# Patient Record
Sex: Female | Born: 1995 | Race: Black or African American | Hispanic: No | Marital: Single | Smoking: Never smoker
Health system: Southern US, Community
[De-identification: ages and names within clinical notes are randomized; demographics above are authoritative.]

## PROBLEM LIST (undated history)

## (undated) DIAGNOSIS — F909 Attention-deficit hyperactivity disorder, unspecified type: Secondary | ICD-10-CM

## (undated) DIAGNOSIS — N946 Dysmenorrhea, unspecified: Secondary | ICD-10-CM

## (undated) DIAGNOSIS — M549 Dorsalgia, unspecified: Secondary | ICD-10-CM

## (undated) HISTORY — DX: Dysmenorrhea, unspecified: N94.6

---
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
## 2017-02-18 ENCOUNTER — Ambulatory Visit: Payer: Medicaid Other | Admitting: Pediatrics

## 2017-05-12 NOTE — Progress Notes (Unsigned)
Error

## 2017-05-30 IMAGING — CT CT T SPINE W/O CM
3 of 4 series · 13 of 33 positions shown, 16 images · IV contrast (iopamidol)
Comparison: None.

CLINICAL DATA: Localized pain at the T12 level. Cystic area under
the skin for 2-3 months.

EXAM:
CT THORACIC SPINE WITH CONTRAST
TECHNIQUE: Multidetector CT images of thoracic was performed according to the
standard protocol following intravenous contrast administration.
CONTRAST:  100mL [id number] IOPAMIDOL ([id number]) INJECTION 61%

[Series 6: sagittal bone · sagittal · 0.23mm/px · 5 of 43 slices shown, 6 images]
[im 15/43  bone]
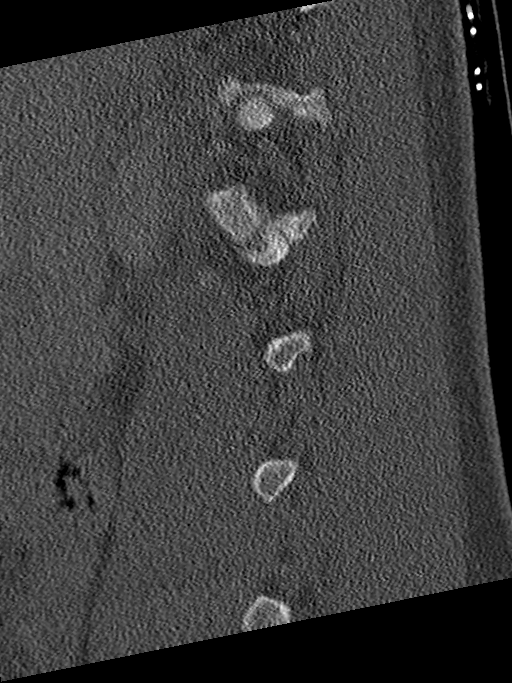
[im 18/43  bone]
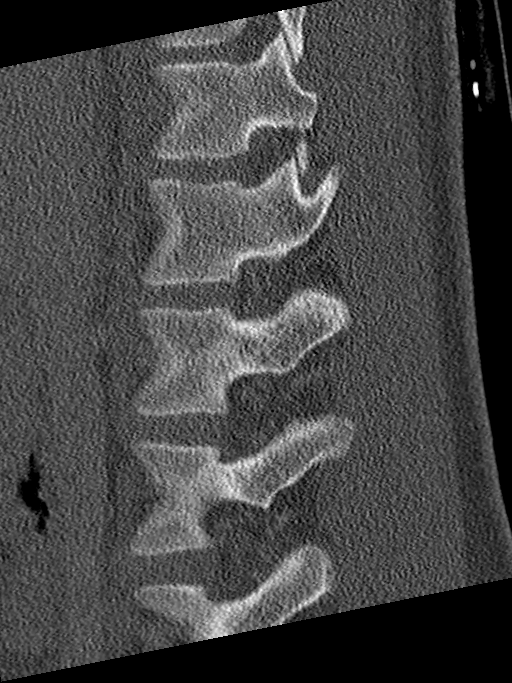
[im 22/43  soft-tissue]
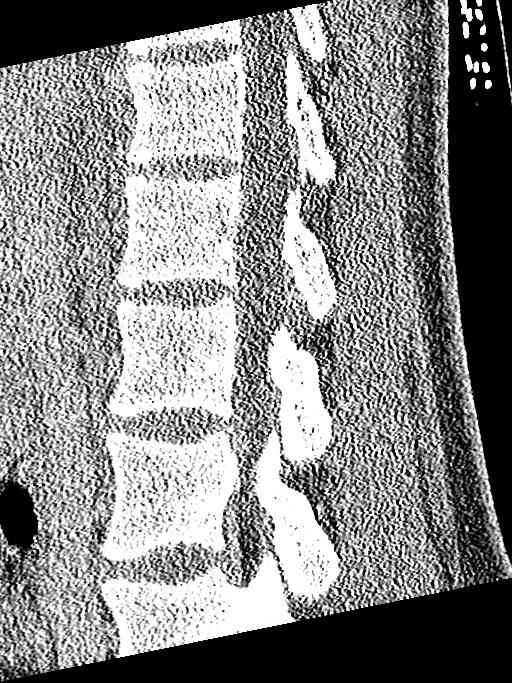
[im 22/43  bone]
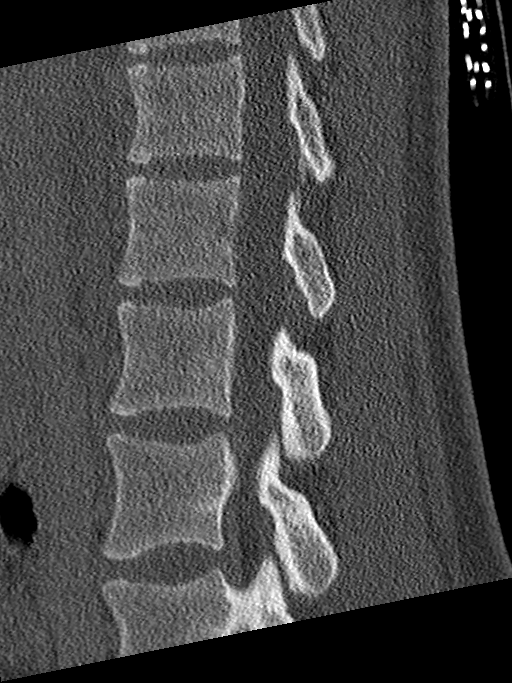
[im 25/43  bone]
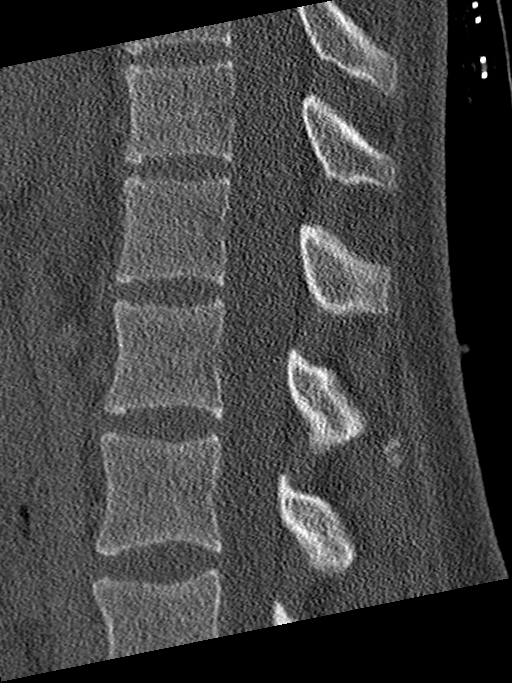
[im 29/43  bone]
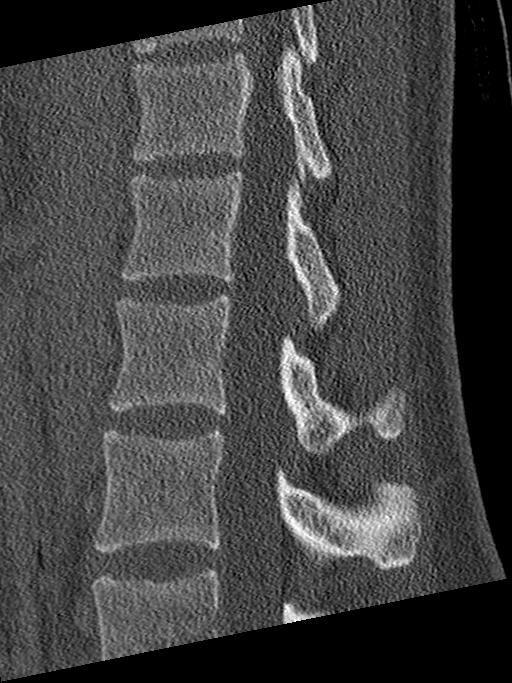

[Series 7: coronal bone · coronal · 0.23mm/px · 3 of 61 slices shown]
[im 13/61  bone]
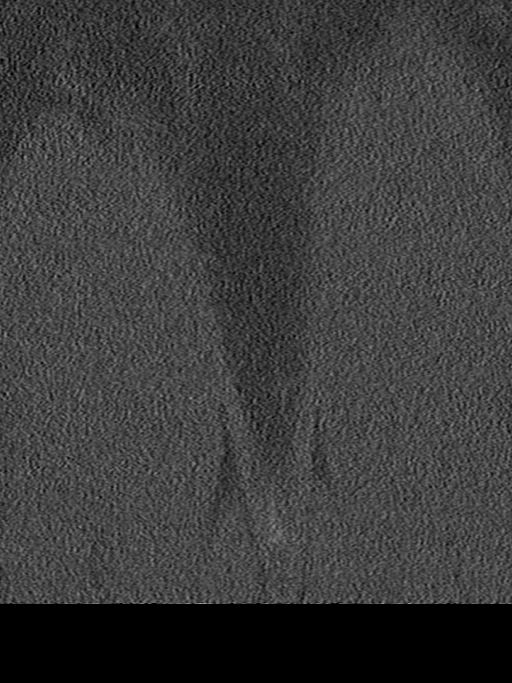
[im 25/61  bone]
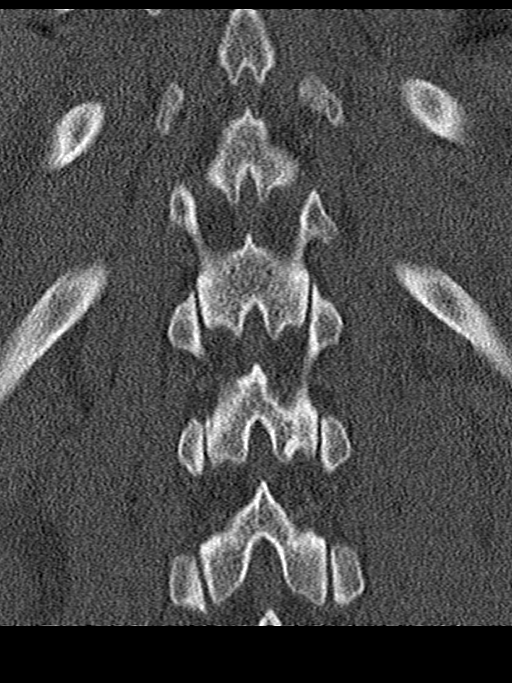
[im 37/61  bone]
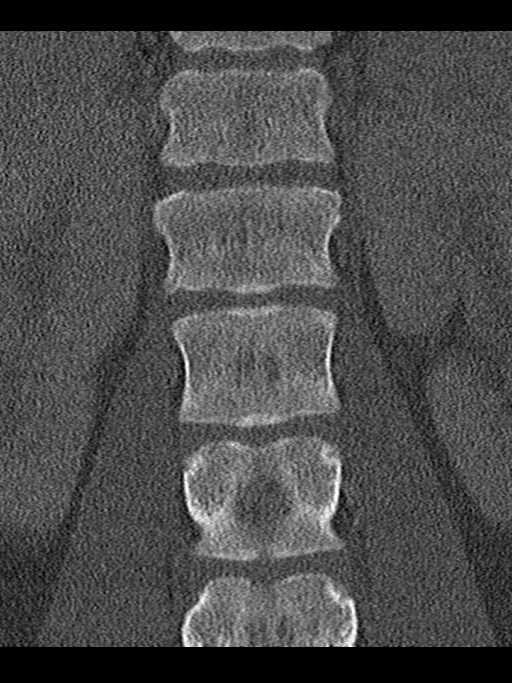

[Series 8: t spine soft true axials · axial · 0.21mm/px · z∈[-345,-250]mm · 5 of 74 slices shown, 7 images]
[im 13/74  soft-tissue]
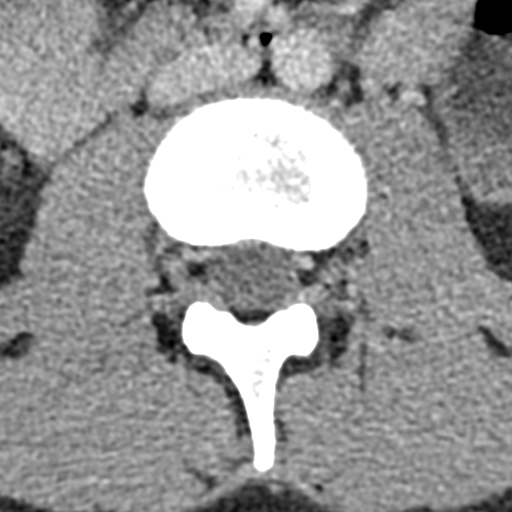
[im 13/74  bone]
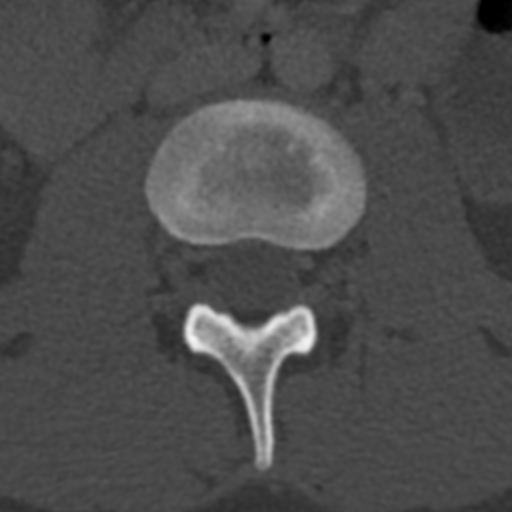
[im 25/74  bone]
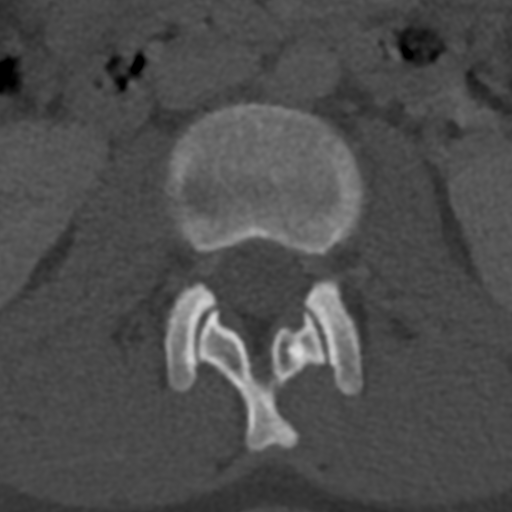
[im 37/74  bone]
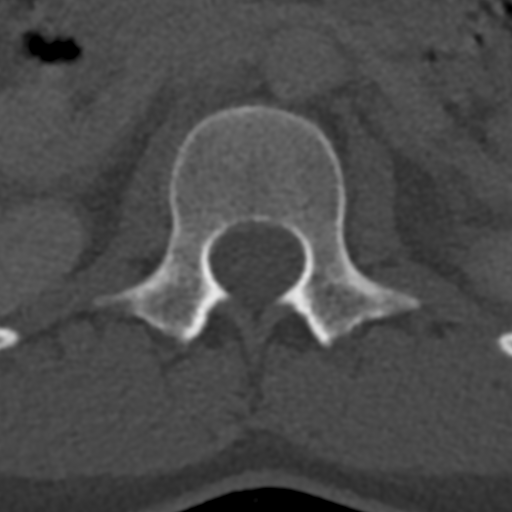
[im 49/74  bone]
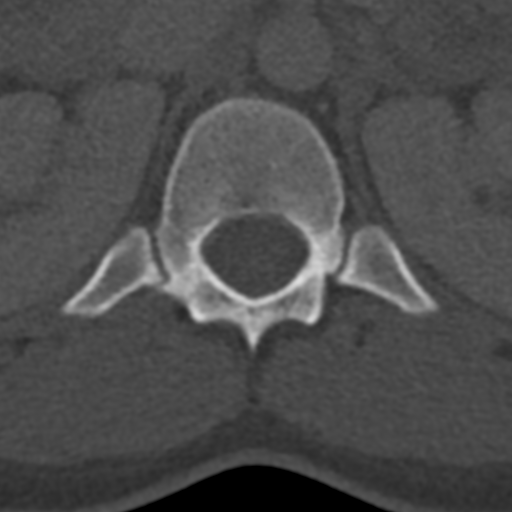
[im 61/74  soft-tissue]
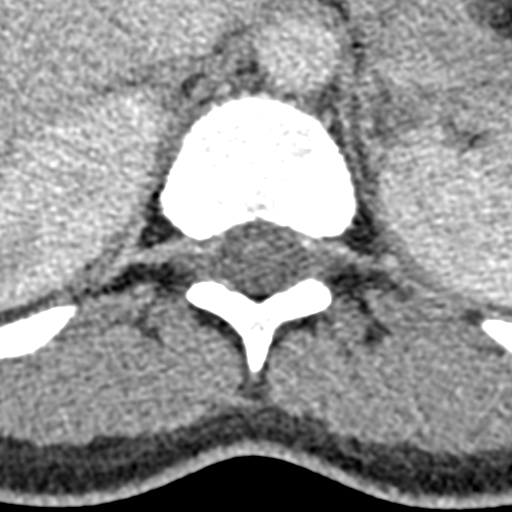
[im 61/74  bone]
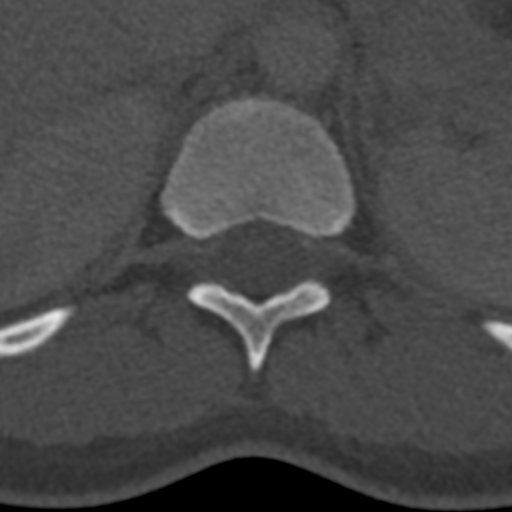

[13 of 33 positions shown; findings below may reference images not displayed]

FINDINGS: A limited thoracic spine CT was performed in the area of clinical
concern as requested by the ordering physician. The lowest
rib-bearing vertebra is designated T12. This study extends from the
inferior T10 to mid L3 levels.

Alignment: Normal.

Vertebrae: Preserved vertebral body heights without evidence of
fracture or osseous lesion. There are no erosive osseous changes to
suggest infectious osteomyelitis.

Paraspinal and other soft tissues: A skin marker placed indicate the
area of clinical concern is located in the midline at the T12-L1
level. No underlying mass, fluid collection, or other abnormality is
identified.

Disc levels: Unremarkable. Widely patent spinal canal and neural
foramina.
IMPRESSION: Unremarkable limited thoracic spine CT. No mass or fluid collection
identified in the area of clinical concern.

## 2018-03-26 ENCOUNTER — Ambulatory Visit (HOSPITAL_COMMUNITY): Admission: EM | Admit: 2018-03-26 | Discharge: 2018-03-26 | Discharge status: Against Medical Advice | Payer: Self-pay

## 2019-08-28 ENCOUNTER — Encounter: Payer: Self-pay | Admitting: Emergency Medicine

## 2019-08-28 ENCOUNTER — Ambulatory Visit
Admission: EM | Admit: 2019-08-28 | Discharge: 2019-08-28 | Disposition: A | Discharge status: Home / Self Care | Payer: Self-pay | Attending: Emergency Medicine | Admitting: Emergency Medicine

## 2019-08-28 DIAGNOSIS — L0231 Cutaneous abscess of buttock: Secondary | ICD-10-CM

## 2019-08-28 MED ORDER — DOXYCYCLINE HYCLATE 100 MG PO CAPS: 100.0000 mg | ORAL_CAPSULE | Freq: Two times a day (BID) | ORAL | 0 refills | Status: DC

## 2019-08-28 NOTE — ED Triage Notes (Signed)
Started Monday, sore area on buttocks. Tender to touch.

## 2019-08-28 NOTE — Discharge Instructions (Addendum)
Apply warm compresses 3-4x daily for 10-15 minutes Wash site daily with warm water and mild soap Take antibiotic as prescribed and to completion Follow up here or with PCP if symptoms persists Return or go to the ED if you have any new or worsening symptoms increased redness, swelling, pain, nausea, vomiting, fever, chills, etc...  

## 2019-08-28 NOTE — ED Provider Notes (Addendum)
[place]   [id number] 08/28/19 Arrival Time: 4818   [id number]  SUBJECTIVE:  Teresa Daniel is a 24 y.o. female who presents with a possible abscess of her buttocks area.  Abscess is hard tender to touch.  Denies drainage and at this time.  Reports similar symptoms in the past that self drained while an antibiotic.  Denies chills, fever, nausea, vomiting, diarrhea.  ROS: As per HPI.  All other pertinent ROS negative.     Past Medical History:  Diagnosis Date  . ADHD (attention deficit hyperactivity disorder)   . Asthma   . Back pain   . Dysmenorrhea in the adolescent    History reviewed. No pertinent surgical history. Allergies  Allergen Reactions  . Codeine Itching and Rash  . Sulfa Antibiotics Itching and Rash  . Sulfasalazine Itching and Rash   No current facility-administered medications on file prior to encounter.   Current Outpatient Medications on File Prior to Encounter  Medication Sig Dispense Refill  . fluticasone (FLONASE) 50 MCG/ACT nasal spray Place 2 sprays into both nostrils daily. 16 g 6  . ibuprofen (ADVIL,MOTRIN) 600 MG tablet Take 1 tablet (600 mg total) by mouth every 6 (six) hours as needed. Take with food 90 tablet 1  . loratadine (CLARITIN) 10 MG tablet Take 1 tablet (10 mg total) by mouth daily. 30 tablet 11  . medroxyPROGESTERone (DEPO-PROVERA) 150 MG/ML injection INJECT 1 ML INTO THE MUSCLE EVERY 3 MONTHS 1 mL 1  . clotrimazole-betamethasone (LOTRISONE) cream Apply to affected area 2 times daily prn 15 g 0  . gabapentin (NEURONTIN) 100 MG capsule Take 300 mg by mouth 3 (three) times daily.    Marland Kitchen terbinafine (LAMISIL) 250 MG tablet Take 1 tablet (250 mg total) by mouth daily. 28 tablet 0   Social History   Socioeconomic History  . Marital status: Single    Spouse name: Not on file  . Number of children: Not on file  . Years of education: Not on file  . Highest education level: Not on file  Occupational History  . Not on file    Tobacco Use  . Smoking status: Never Smoker  . Smokeless tobacco: Never Used  Substance and Sexual Activity  . Alcohol use: No  . Drug use: No  . Sexual activity: Not on file  Other Topics Concern  . Not on file  Social History Narrative  . Not on file   Social Determinants of Health   Financial Resource Strain:   . Difficulty of Paying Living Expenses:   Food Insecurity:   . Worried About Charity fundraiser in the Last Year:   . Arboriculturist in the Last Year:   Transportation Needs:   . Film/video editor (Medical):   Marland Kitchen Lack of Transportation (Non-Medical):   Physical Activity:   . Days of Exercise per Week:   . Minutes of Exercise per Session:   Stress:   . Feeling of Stress :   Social Connections:   . Frequency of Communication with Friends and Family:   . Frequency of Social Gatherings with Friends and Family:   . Attends Religious Services:   . Active Member of Clubs or Organizations:   . Attends Archivist Meetings:   Marland Kitchen Marital Status:   Intimate Partner Violence:   . Fear of Current or Ex-Partner:   . Emotionally Abused:   Marland Kitchen Physically Abused:   . Sexually Abused:    Family History  Problem Relation  Age of Onset  . Hypertension Mother     OBJECTIVE:  Vitals:   08/28/19 1458  BP: (!) 125/55  Pulse: (!) 110  Resp: 16  Temp: 98.9 F (37.2 C)  TempSrc: Oral  SpO2: 97%     Physical Exam Vitals and nursing note reviewed. Exam conducted with a chaperone present.  Constitutional:      General: She is not in acute distress.    Appearance: Normal appearance. She is normal weight. She is not ill-appearing, toxic-appearing or diaphoretic.  Cardiovascular:     Rate and Rhythm: Regular rhythm. Tachycardia present.     Pulses: Normal pulses.     Heart sounds: Normal heart sounds. No murmur. No friction rub. No gallop.   Pulmonary:     Effort: Pulmonary effort is normal. No respiratory distress.     Breath sounds: Normal breath sounds.  No stridor. No wheezing, rhonchi or rales.  Chest:     Chest wall: No tenderness.  Skin:    General: Skin is warm.     Findings: Abscess present.     Comments: Skin: 2.5 cm induration of her Buttocks; tender to touch; no active drainage  Neurological:     Mental Status: She is alert.     Procedure:  ASSESSMENT & PLAN:  1. Cutaneous abscess of buttock     Meds ordered this encounter  Medications  . doxycycline (VIBRAMYCIN) 100 MG capsule    Sig: Take 1 capsule (100 mg total) by mouth 2 (two) times daily.    Dispense:  20 capsule    Refill:  0    Discharge instructions Apply warm compresses 3-4x daily for 10-15 minutes Wash site daily with warm water and mild soap Take antibiotic as prescribed and to completion Follow up here or with PCP if symptoms persists Return or go to the ED if you have any new or worsening symptoms increased redness, swelling, pain, nausea, vomiting, fever, chills, etc...     Reviewed expectations re: course of current medical issues. Questions answered. Outlined signs and symptoms indicating need for more acute intervention. Patient verbalized understanding. After Visit Summary given.          Emerson Monte, FNP 08/28/19 1527    Emerson Monte, FNP 08/28/19 1531

## 2019-08-29 ENCOUNTER — Ambulatory Visit
Admission: EM | Admit: 2019-08-29 | Discharge: 2019-08-29 | Disposition: A | Discharge status: Home / Self Care | Payer: Medicaid Other | Attending: Emergency Medicine | Admitting: Emergency Medicine

## 2019-08-29 ENCOUNTER — Other Ambulatory Visit: Payer: Self-pay

## 2019-08-29 DIAGNOSIS — K611 Rectal abscess: Secondary | ICD-10-CM

## 2019-08-29 MED ORDER — ACETAMINOPHEN 325 MG PO TABS: 650.0000 mg | ORAL_TABLET | Freq: Four times a day (QID) | ORAL | 0 refills | Status: DC | PRN

## 2019-08-29 MED ORDER — CEFTRIAXONE SODIUM 1 G IJ SOLR
1.0000 g | Freq: Once | INTRAMUSCULAR | Status: AC
  Administered 2019-08-29: 1 g via INTRAMUSCULAR

## 2019-08-29 NOTE — Discharge Instructions (Addendum)
Declines incision and drainage at this time 1 gram of rocephin given in office Continue with warm compresses 3-4x daily for 10-15 minutes Wash site daily with warm water and mild soap Keep covered to avoid friction Continue with antibiotic as prescribed and to completion Tylenol prescribed for pain Follow up here or with PCP if symptoms persists Return or go to the ED if you have any new or worsening symptoms increased redness, swelling, pain, nausea, vomiting, fever, chills, etc..Marland Kitchen

## 2019-08-29 NOTE — ED Triage Notes (Signed)
Pt returns with worsening abscess, has taken 2 doses of antibiotics

## 2019-08-29 NOTE — ED Provider Notes (Signed)
[place]   [id number] 08/29/19 Arrival Time: 1206   CC: ABSCESS  SUBJECTIVE:  Teresa Daniel is a 24 y.o. female who presents with a possible abscess of her buttock x 1.5 weeks.  Denies precipitating event or trauma.  Has tried taking 3 doses of doxycycline without relief.  Sore to the touch.  Reports similar symptoms in the past with abscess.  Complains of associated nausea.  Denies fever, chills, vomiting.    ROS: As per HPI.  All other pertinent ROS negative.     Past Medical History:  Diagnosis Date  . ADHD (attention deficit hyperactivity disorder)   . Asthma   . Back pain   . Dysmenorrhea in the adolescent    History reviewed. No pertinent surgical history. Allergies  Allergen Reactions  . Codeine Itching and Rash  . Sulfa Antibiotics Itching and Rash  . Sulfasalazine Itching and Rash   No current facility-administered medications on file prior to encounter.   Current Outpatient Medications on File Prior to Encounter  Medication Sig Dispense Refill  . clotrimazole-betamethasone (LOTRISONE) cream Apply to affected area 2 times daily prn 15 g 0  . doxycycline (VIBRAMYCIN) 100 MG capsule Take 1 capsule (100 mg total) by mouth 2 (two) times daily. 20 capsule 0  . fluticasone (FLONASE) 50 MCG/ACT nasal spray Place 2 sprays into both nostrils daily. 16 g 6  . gabapentin (NEURONTIN) 100 MG capsule Take 300 mg by mouth 3 (three) times daily.    Marland Kitchen ibuprofen (ADVIL,MOTRIN) 600 MG tablet Take 1 tablet (600 mg total) by mouth every 6 (six) hours as needed. Take with food 90 tablet 1  . loratadine (CLARITIN) 10 MG tablet Take 1 tablet (10 mg total) by mouth daily. 30 tablet 11  . medroxyPROGESTERone (DEPO-PROVERA) 150 MG/ML injection INJECT 1 ML INTO THE MUSCLE EVERY 3 MONTHS 1 mL 1  . terbinafine (LAMISIL) 250 MG tablet Take 1 tablet (250 mg total) by mouth daily. 28 tablet 0   Social History   Socioeconomic History  . Marital status: Single    Spouse name:  Not on file  . Number of children: Not on file  . Years of education: Not on file  . Highest education level: Not on file  Occupational History  . Not on file  Tobacco Use  . Smoking status: Never Smoker  . Smokeless tobacco: Never Used  Substance and Sexual Activity  . Alcohol use: No  . Drug use: No  . Sexual activity: Not on file  Other Topics Concern  . Not on file  Social History Narrative  . Not on file   Social Determinants of Health   Financial Resource Strain:   . Difficulty of Paying Living Expenses:   Food Insecurity:   . Worried About Charity fundraiser in the Last Year:   . Arboriculturist in the Last Year:   Transportation Needs:   . Film/video editor (Medical):   Marland Kitchen Lack of Transportation (Non-Medical):   Physical Activity:   . Days of Exercise per Week:   . Minutes of Exercise per Session:   Stress:   . Feeling of Stress :   Social Connections:   . Frequency of Communication with Friends and Family:   . Frequency of Social Gatherings with Friends and Family:   . Attends Religious Services:   . Active Member of Clubs or Organizations:   . Attends Archivist Meetings:   Marland Kitchen Marital Status:   Intimate Production manager  Violence:   . Fear of Current or Ex-Partner:   . Emotionally Abused:   Marland Kitchen Physically Abused:   . Sexually Abused:    Family History  Problem Relation Age of Onset  . Hypertension Mother     OBJECTIVE:  Vitals:   08/29/19 1213  BP: 100/64  Pulse: (!) 101  Resp: 18  Temp: 99.3 F (37.4 C)  SpO2: 96%     General appearance: alert; no distress Skin: 3 x 1 cm abscess to intergluteal cleft, TTP, mild overlying erythema, no obvious bleeding or drainage Psychological: alert and cooperative; normal mood and affect   ASSESSMENT & PLAN:  1. Perirectal abscess     Meds ordered this encounter  Medications  . cefTRIAXone (ROCEPHIN) injection 1 g  . acetaminophen (TYLENOL) 325 MG tablet    Sig: Take 2 tablets (650 mg total)  by mouth every 6 (six) hours as needed.    Dispense:  30 tablet    Refill:  0    Order Specific Question:   Supervising Provider    Answer:   Raylene Everts [[id number]]   Declines incision and drainage at this time 1 gram of rocephin given in office Continue with warm compresses 3-4x daily for 10-15 minutes Wash site daily with warm water and mild soap Keep covered to avoid friction Continue with antibiotic as prescribed and to completion Ibuprofen 800 mg prescribed.  Take as directed Follow up here or with PCP if symptoms persists Return or go to the ED if you have any new or worsening symptoms increased redness, swelling, pain, nausea, vomiting, fever, chills, etc...   Reviewed expectations re: course of current medical issues. Questions answered. Outlined signs and symptoms indicating need for more acute intervention. Patient verbalized understanding. After Visit Summary given.          Lestine Box, PA-C 08/29/19 1231

## 2019-08-31 ENCOUNTER — Emergency Department (HOSPITAL_COMMUNITY)
Admission: EM | Admit: 2019-08-31 | Discharge: 2019-08-31 | Disposition: A | Discharge status: Home / Self Care | Payer: Medicaid Other | Attending: Emergency Medicine | Admitting: Emergency Medicine

## 2019-08-31 ENCOUNTER — Other Ambulatory Visit: Payer: Self-pay

## 2019-08-31 ENCOUNTER — Encounter (HOSPITAL_COMMUNITY): Payer: Self-pay | Admitting: Emergency Medicine

## 2019-08-31 ENCOUNTER — Telehealth (HOSPITAL_COMMUNITY): Payer: Self-pay | Admitting: Emergency Medicine

## 2019-08-31 DIAGNOSIS — J45909 Unspecified asthma, uncomplicated: Secondary | ICD-10-CM | POA: Insufficient documentation

## 2019-08-31 DIAGNOSIS — L0501 Pilonidal cyst with abscess: Secondary | ICD-10-CM | POA: Insufficient documentation

## 2019-08-31 DIAGNOSIS — Z882 Allergy status to sulfonamides status: Secondary | ICD-10-CM | POA: Insufficient documentation

## 2019-08-31 DIAGNOSIS — Z885 Allergy status to narcotic agent status: Secondary | ICD-10-CM | POA: Insufficient documentation

## 2019-08-31 MED ORDER — FENTANYL CITRATE (PF) 100 MCG/2ML IJ SOLN
50.0000 ug | Freq: Once | INTRAMUSCULAR | Status: AC
  Administered 2019-08-31: 50 ug via INTRAMUSCULAR
  Filled 2019-08-31: qty 2

## 2019-08-31 MED ORDER — POVIDONE-IODINE 10 % EX SOLN
CUTANEOUS | Status: AC
  Filled 2019-08-31: qty 15

## 2019-08-31 MED ORDER — LIDOCAINE-EPINEPHRINE 2 %-1:200000 IJ SOLN
10.0000 mL | Freq: Once | INTRAMUSCULAR | Status: AC
Start: 2019-08-31 — End: 2019-08-31
  Administered 2019-08-31: 10 mL
  Filled 2019-08-31: qty 10

## 2019-08-31 MED ORDER — TRAMADOL HCL 50 MG PO TABS: 100.0000 mg | ORAL_TABLET | Freq: Four times a day (QID) | ORAL | 0 refills | Status: DC | PRN

## 2019-08-31 MED ORDER — POVIDONE-IODINE 10 % EX OINT
TOPICAL_OINTMENT | Freq: Once | CUTANEOUS | Status: DC
  Filled 2019-08-31: qty 28.35

## 2019-08-31 NOTE — ED Triage Notes (Signed)
Pt states "I have a abscess at the top of my butt crack" that's been there for 2 week. Pt states she was seen at urgent care and given 2 antibiotics.

## 2019-08-31 NOTE — ED Provider Notes (Signed)
[facility] EMERGENCY DEPARTMENT Provider Note   CSN: [id number] Arrival date & time: 08/31/19  9381   Time seen 6:05 AM  History Chief Complaint  Patient presents with  . Abscess    Teresa Daniel is a 24 y.o. female.  HPI   Patient states she started having some pain in her pilonidal area last week.  Initially there was no swelling.  She states it started swelling 2 to 3 days later.  She went to urgent care on June 6 and was started on doxycycline.  She returned on June 7 because she was not feeling better.  They discussed drainage but she was told by the provider they did not have any way to numb it for them to drain it.  She was given a shot of Rocephin in the office.  Patient returns complaining of continued pain and swelling.  She states she has had these before but normally they drain on their own.  She states she has had no other abscesses other than in this area.  She is unaware of any family history of this.  [facility]   Past Medical History:  Diagnosis Date  . ADHD (attention deficit hyperactivity disorder)   . Asthma   . Back pain   . Dysmenorrhea in the adolescent     Patient Active Problem List   Diagnosis Date Noted  . Thoracic back pain 08/01/2016  . Blurred vision 07/31/2016  . Numbness and tingling 07/31/2016  . Menstrual migraine 08/10/2014  . Dysmenorrhea 01/03/2014  . ADHD (attention deficit hyperactivity disorder) 06/09/2012  . Asthma, chronic 06/09/2012    History reviewed. No pertinent surgical history.   OB History   No obstetric history on file.     Family History  Problem Relation Age of Onset  . Hypertension Mother     Social History   Tobacco Use  . Smoking status: Never Smoker  . Smokeless tobacco: Never Used  Substance Use Topics  . Alcohol use: No  . Drug use: No    Home Medications Prior to Admission medications   Medication Sig Start Date End Date Taking? Authorizing Provider    acetaminophen (TYLENOL) 325 MG tablet Take 2 tablets (650 mg total) by mouth every 6 (six) hours as needed. 08/29/19   Wurst, [place], PA-C  clotrimazole-betamethasone (LOTRISONE) cream Apply to affected area 2 times daily prn 11/02/16   Barnet Glasgow, NP  doxycycline (VIBRAMYCIN) 100 MG capsule Take 1 capsule (100 mg total) by mouth 2 (two) times daily. 08/28/19   Avegno, Darrelyn Hillock, FNP  fluticasone (FLONASE) 50 MCG/ACT nasal spray Place 2 sprays into both nostrils daily. 06/15/15   Evern Core, MD  gabapentin (NEURONTIN) 100 MG capsule Take 300 mg by mouth 3 (three) times daily.    [provider]  ibuprofen (ADVIL,MOTRIN) 600 MG tablet Take 1 tablet (600 mg total) by mouth every 6 (six) hours as needed. Take with food 06/27/15   Carole Civil, MD  loratadine (CLARITIN) 10 MG tablet Take 1 tablet (10 mg total) by mouth daily. 06/15/15   Evern Core, MD  medroxyPROGESTERone (DEPO-PROVERA) 150 MG/ML injection INJECT 1 ML INTO THE MUSCLE EVERY 3 MONTHS 08/20/16   McDonell, Kyra Manges, MD  terbinafine (LAMISIL) 250 MG tablet Take 1 tablet (250 mg total) by mouth daily. 11/02/16   Barnet Glasgow, NP  traMADol (ULTRAM) 50 MG tablet Take 2 tablets (100 mg total) by mouth every 6 (six) hours as needed for moderate pain. 08/31/19  Rolland Porter, MD    Allergies    Codeine, Sulfa antibiotics, and Sulfasalazine  Review of Systems   Review of Systems  All other systems reviewed and are negative.   Physical Exam Updated Vital Signs BP 114/74 (BP Location: Right Arm)   Pulse 84   Temp 98.6 F (37 C) (Oral)   Resp 16   Ht 5\' 6"  (1.676 m)   Wt 73.5 kg   SpO2 99%   BMI 26.15 kg/m   Physical Exam Vitals and nursing note reviewed.  Constitutional:      Appearance: Normal appearance. She is normal weight.  HENT:     Head: Normocephalic and atraumatic.     Right Ear: External ear normal.     Left Ear: External ear normal.  Eyes:     Extraocular Movements:  Extraocular movements intact.     Conjunctiva/sclera: Conjunctivae normal.  Cardiovascular:     Rate and Rhythm: Normal rate.  Pulmonary:     Effort: Pulmonary effort is normal.  Musculoskeletal:     Cervical back: Normal range of motion.  Skin:    General: Skin is warm and dry.     Findings: Erythema present.     Comments: Patient has a reddened swollen area in the midline between her superior buttocks consistent with a pilonidal abscess.  It is very tender to palpation.  Neurological:     General: No focal deficit present.     Mental Status: She is alert and oriented to person, place, and time.     Cranial Nerves: No cranial nerve deficit.  Psychiatric:        Mood and Affect: Mood normal.        Behavior: Behavior normal.        Thought Content: Thought content normal.     ED Results / Procedures / Treatments   Labs (all labs ordered are listed, but only abnormal results are displayed) Labs Reviewed - No data to display  EKG None  Radiology No results found.  Procedures .Marland KitchenIncision and Drainage  Date/Time: 08/31/2019 6:57 AM Performed by: Rolland Porter, MD Authorized by: Rolland Porter, MD   Consent:    Consent obtained:  Verbal   Consent given by:  Patient Location:    Type:  Abscess   Location:  Anogenital   Anogenital location:  Pilonidal Pre-procedure details:    Skin preparation:  Betadine Anesthesia (see MAR for exact dosages):    Anesthesia method:  Local infiltration   Local anesthetic:  Lidocaine 2% WITH epi Procedure type:    Complexity:  Complex Procedure details:    Incision types:  Single straight   Scalpel blade:  11   Wound management:  Probed and deloculated   Drainage:  Purulent   Drainage amount:  Copious   Wound treatment:  Wound left open   Packing materials:  1/4 in gauze Post-procedure details:    Patient tolerance of procedure:  Tolerated well, no immediate complications   (including critical care time)  Medications Ordered in  ED Medications  povidone-iodine (BETADINE) 10 % ointment ( Topical Canceled Entry 08/31/19 0622)  fentaNYL (SUBLIMAZE) injection 50 mcg (has no administration in time range)  lidocaine-EPINEPHrine (XYLOCAINE W/EPI) 2 %-1:200000 (PF) injection 10 mL (10 mLs Infiltration Given by Other 08/31/19 3244)  povidone-iodine (BETADINE) 10 % external solution (  Given 08/31/19 0102)    ED Course  I have reviewed the triage vital signs and the nursing notes.  Pertinent labs & imaging results that were available  during my care of the patient were reviewed by me and considered in my medical decision making (see chart for details).    MDM Rules/Calculators/A&P                      Patient already has antibiotics to take at home.  She was given pain medication and advised on care such as soaking in a hot tub.  We also discussed how to remove the packing in 2 to 3 days.  We also discussed since she has had these before she should consider seeing a surgeon to get definitive treatment of the pilonidal cyst that keeps getting infected.   Final Clinical Impression(s) / ED Diagnoses Final diagnoses:  Pilonidal abscess    Rx / DC Orders ED Discharge Orders         Ordered    traMADol (ULTRAM) 50 MG tablet  Every 6 hours PRN     08/31/19 0653        OTC ibuprofen and acetaminophen   Plan discharge  Rolland Porter, MD, Barbette Or, MD 08/31/19 [phone]

## 2019-08-31 NOTE — Telephone Encounter (Cosign Needed)
Patient did not receive original prescription for tramadol.  I have verified this with the pharmacy.  Prescription resent to requested pharmacy.

## 2019-08-31 NOTE — Discharge Instructions (Signed)
Take ibuprofen 400 mg with acetaminophen 650 mg 4 times a day with the tramadol for pain.  Try to soak in a tub of warm water for 20 to 30 minutes several times a day.  Finish the antibiotic she were already prescribed.  In about 2 to 3 days from now while you are soaking in the warm tub pull out the packing that was put in the abscess to keep the wound open.  Do not worry if it comes out before that.  You should consider seeing a surgeon about this since you have had it a couple of times before.  Please call Dr. Constance Haw office to get an appointment.

## 2019-09-30 ENCOUNTER — Encounter: Payer: Self-pay | Admitting: Emergency Medicine

## 2019-09-30 ENCOUNTER — Ambulatory Visit
Admission: EM | Admit: 2019-09-30 | Discharge: 2019-09-30 | Disposition: A | Discharge status: Home / Self Care | Payer: Medicaid Other | Attending: Emergency Medicine | Admitting: Emergency Medicine

## 2019-09-30 DIAGNOSIS — N898 Other specified noninflammatory disorders of vagina: Secondary | ICD-10-CM

## 2019-09-30 MED ORDER — FLUCONAZOLE 200 MG PO TABS
ORAL_TABLET | ORAL | 0 refills | Status: DC
Start: 2019-09-30 — End: 2020-03-20

## 2019-09-30 NOTE — ED Provider Notes (Signed)
[place]   [id number] 09/30/19 Arrival Time: 1831   [id number] itching  SUBJECTIVE:  Teresa Daniel is a 24 y.o. female who presents with complaints of vaginal itching x 2 days.  Admits to sexual activity prior to symptoms.  Denies concern for STDs, or STD testing.  Denies alleviating or aggravating factors.  She reports similar symptoms in the past and was diagnosed with yeast infection that improved with difluca.  She denies fever, chills, nausea, vomiting, abdominal or pelvic pain, urinary symptoms, vaginal odor, vaginal bleeding, dyspareunia, vaginal rashes or lesions.   No LMP recorded. Patient has had an injection.  ROS: As per HPI.  All other pertinent ROS negative.     Past Medical History:  Diagnosis Date   ADHD (attention deficit hyperactivity disorder)    Asthma    Back pain    Dysmenorrhea in the adolescent    History reviewed. No pertinent surgical history. Allergies  Allergen Reactions   Codeine Itching and Rash   Sulfa Antibiotics Itching and Rash   Sulfasalazine Itching and Rash   No current facility-administered medications on file prior to encounter.   Current Outpatient Medications on File Prior to Encounter  Medication Sig Dispense Refill   acetaminophen (TYLENOL) 325 MG tablet Take 2 tablets (650 mg total) by mouth every 6 (six) hours as needed. 30 tablet 0   fluticasone (FLONASE) 50 MCG/ACT nasal spray Place 2 sprays into both nostrils daily. 16 g 6   ibuprofen (ADVIL,MOTRIN) 600 MG tablet Take 1 tablet (600 mg total) by mouth every 6 (six) hours as needed. Take with food 90 tablet 1   loratadine (CLARITIN) 10 MG tablet Take 1 tablet (10 mg total) by mouth daily. 30 tablet 11   [DISCONTINUED] gabapentin (NEURONTIN) 100 MG capsule Take 300 mg by mouth 3 (three) times daily.     [DISCONTINUED] medroxyPROGESTERone (DEPO-PROVERA) 150 MG/ML injection INJECT 1 ML INTO THE MUSCLE EVERY 3 MONTHS 1 mL 1    Social History    Socioeconomic History   Marital status: Single    Spouse name: Not on file   Number of children: Not on file   Years of education: Not on file   Highest education level: Not on file  Occupational History   Not on file  Tobacco Use   Smoking status: Never Smoker   Smokeless tobacco: Never Used  Substance and Sexual Activity   Alcohol use: No   Drug use: No   Sexual activity: Not on file  Other Topics Concern   Not on file  Social History Narrative   Not on file   Social Determinants of Health   Financial Resource Strain:    Difficulty of Paying Living Expenses:   Food Insecurity:    Worried About Running Out of Food in the Last Year:    Arboriculturist in the Last Year:   Transportation Needs:    Film/video editor (Medical):    Lack of Transportation (Non-Medical):   Physical Activity:    Days of Exercise per Week:    Minutes of Exercise per Session:   Stress:    Feeling of Stress :   Social Connections:    Frequency of Communication with Friends and Family:    Frequency of Social Gatherings with Friends and Family:    Attends Religious Services:    Active Member of Clubs or Organizations:    Attends Archivist Meetings:    Marital Status:   Intimate Partner Violence:  Fear of Current or Ex-Partner:    Emotionally Abused:    Physically Abused:    Sexually Abused:    Family History  Problem Relation Age of Onset   Hypertension Mother     OBJECTIVE:  Vitals:   09/30/19 1910 09/30/19 1913  BP:  121/82  Pulse:  82  Resp:  17  Temp:  98.9 F (37.2 C)  TempSrc:  Oral  SpO2:  98%  Weight: 162 lb (73.5 kg)   Height: 5\' 6"  (1.676 m)      General appearance: Alert, NAD, appears stated age Head: NCAT Throat: lips, mucosa, and tongue normal; teeth and gums normal Lungs: CTA bilaterally without adventitious breath sounds Heart: regular rate and rhythm.   Back: no CVA tenderness Abdomen: soft, non-tender;  bowel sounds normal; no guarding or rebound tenderness GU: deferred Skin: warm and dry Psychological:  Alert and cooperative. Normal mood and affect.  ASSESSMENT & PLAN:  1. Vaginal itching     Meds ordered this encounter  Medications   fluconazole (DIFLUCAN) 200 MG tablet    Sig: Take one dose by mouth, wait 72 hours, and then take second dose by mouth    Dispense:  2 tablet    Refill:  0    Order Specific Question:   Supervising Provider    Answer:   Raylene Everts [[id number]]   Declines STD testing at this time Prescribed diflucan 200 mg once daily and then second dose 72 hours later Follow up with PCP Return here or go to ER if you have any new or worsening symptoms fever, chills, nausea, vomiting, abdominal or pelvic pain, painful intercourse, vaginal discharge, vaginal bleeding, persistent symptoms despite treatment, etc...  Reviewed expectations re: course of current medical issues. Questions answered. Outlined signs and symptoms indicating need for more acute intervention. Patient verbalized understanding. After Visit Summary given.       Lestine Box, PA-C 09/30/19 1925

## 2019-09-30 NOTE — Discharge Instructions (Signed)
  Declines STD testing at this time Prescribed diflucan 200 mg once daily and then second dose 72 hours later Follow up with PCP Return here or go to ER if you have any new or worsening symptoms fever, chills, nausea, vomiting, abdominal or pelvic pain, painful intercourse, vaginal discharge, vaginal bleeding, persistent symptoms despite treatment, etc..Marland Kitchen

## 2019-09-30 NOTE — ED Triage Notes (Signed)
Vaginal itching x 2 days.  Pt has tried [employer] with no relief.  Pt reports the oral medication has worked in the past.

## 2019-11-25 ENCOUNTER — Other Ambulatory Visit: Payer: Self-pay

## 2019-11-25 ENCOUNTER — Encounter: Payer: Self-pay | Admitting: Emergency Medicine

## 2019-11-25 ENCOUNTER — Ambulatory Visit
Admission: EM | Admit: 2019-11-25 | Discharge: 2019-11-25 | Disposition: A | Discharge status: Home / Self Care | Payer: Medicaid Other

## 2019-11-25 DIAGNOSIS — R0989 Other specified symptoms and signs involving the circulatory and respiratory systems: Secondary | ICD-10-CM

## 2019-11-25 NOTE — ED Provider Notes (Signed)
[place]   [id number] 11/25/19 Arrival Time: 2836  CC:Tonsils  SUBJECTIVE: History from: patient.  Teresa Daniel is a 24 y.o. female who presents with changes in appearance of tonsils x 3 weeks.  Denies sick exposure to strep, flu or mono, or precipitating event.  Has tried OTC medications without relief.  Denies aggravating factors.  Denies previous symptoms in the past.   Denies fever, chills, fatigue, ear pain, sinus pain, rhinorrhea, nasal congestion, cough, SOB, wheezing, chest pain, nausea, rash, changes in bowel or bladder habits.     ROS: As per HPI.  All other pertinent ROS negative.     Past Medical History:  Diagnosis Date  . ADHD (attention deficit hyperactivity disorder)   . Asthma   . Back pain   . Dysmenorrhea in the adolescent    No past surgical history on file. Allergies  Allergen Reactions  . Codeine Itching and Rash  . Sulfa Antibiotics Itching and Rash  . Sulfasalazine Itching and Rash   No current facility-administered medications on file prior to encounter.   Current Outpatient Medications on File Prior to Encounter  Medication Sig Dispense Refill  . acetaminophen (TYLENOL) 325 MG tablet Take 2 tablets (650 mg total) by mouth every 6 (six) hours as needed. 30 tablet 0  . fluconazole (DIFLUCAN) 200 MG tablet Take one dose by mouth, wait 72 hours, and then take second dose by mouth 2 tablet 0  . fluticasone (FLONASE) 50 MCG/ACT nasal spray Place 2 sprays into both nostrils daily. 16 g 6  . ibuprofen (ADVIL,MOTRIN) 600 MG tablet Take 1 tablet (600 mg total) by mouth every 6 (six) hours as needed. Take with food 90 tablet 1  . loratadine (CLARITIN) 10 MG tablet Take 1 tablet (10 mg total) by mouth daily. 30 tablet 11  . [DISCONTINUED] gabapentin (NEURONTIN) 100 MG capsule Take 300 mg by mouth 3 (three) times daily.    . [DISCONTINUED] medroxyPROGESTERone (DEPO-PROVERA) 150 MG/ML injection INJECT 1 ML INTO THE MUSCLE EVERY 3 MONTHS 1 mL 1    Social History   Socioeconomic History  . Marital status: Single    Spouse name: Not on file  . Number of children: Not on file  . Years of education: Not on file  . Highest education level: Not on file  Occupational History  . Not on file  Tobacco Use  . Smoking status: Never Smoker  . Smokeless tobacco: Never Used  Substance and Sexual Activity  . Alcohol use: No  . Drug use: No  . Sexual activity: Not on file  Other Topics Concern  . Not on file  Social History Narrative  . Not on file   Social Determinants of Health   Financial Resource Strain:   . Difficulty of Paying Living Expenses: Not on file  Food Insecurity:   . Worried About Charity fundraiser in the Last Year: Not on file  . Ran Out of Food in the Last Year: Not on file  Transportation Needs:   . Lack of Transportation (Medical): Not on file  . Lack of Transportation (Non-Medical): Not on file  Physical Activity:   . Days of Exercise per Week: Not on file  . Minutes of Exercise per Session: Not on file  Stress:   . Feeling of Stress : Not on file  Social Connections:   . Frequency of Communication with Friends and Family: Not on file  . Frequency of Social Gatherings with Friends and Family: Not on file  .  Attends Religious Services: Not on file  . Active Member of Clubs or Organizations: Not on file  . Attends Archivist Meetings: Not on file  . Marital Status: Not on file  Intimate Partner Violence:   . Fear of Current or Ex-Partner: Not on file  . Emotionally Abused: Not on file  . Physically Abused: Not on file  . Sexually Abused: Not on file   Family History  Problem Relation Age of Onset  . Hypertension Mother     OBJECTIVE:  Vitals:   11/25/19 1057  BP: 138/86  Pulse: 86  Resp: 16  Temp: 97.9 F (36.6 C)  TempSrc: Oral  SpO2: 98%     General appearance: alert; well-appearing, nontoxic; speaking in full sentences and tolerating own secretions HEENT: NCAT; Ears:  EACs clear, TMs pearly gray; Eyes: PERRL.  EOM grossly intact.Nose: nares patent without rhinorrhea, Throat: oropharynx clear, tonsils non erythematous or enlarged, uvula midline, no obvious lesions, sores, exudates, or stones present in tonsils Neck: supple without LAD Lungs: unlabored respirations, symmetrical air entry; cough: absent; no respiratory distress; CTAB Heart: regular rate and rhythm.  Skin: warm and dry Psychological: alert and cooperative; normal mood and affect   ASSESSMENT & PLAN:  1. Tonsil symptom     Everything looks great today! Follow up with PCP or ENT as needed Return or go to ER if patient has any new or worsening symptoms such as fever, chills, nausea, vomiting, sore throat, cough, abdominal pain, chest pain, changes in bowel or bladder habits, etc...   Reviewed expectations re: course of current medical issues. Questions answered. Outlined signs and symptoms indicating need for more acute intervention. Patient verbalized understanding. After Visit Summary given.        Lestine Box, PA-C 11/25/19 1103

## 2019-11-25 NOTE — ED Triage Notes (Signed)
Pt triaged and dc by provider

## 2019-11-25 NOTE — Discharge Instructions (Signed)
Everything looks great today! Follow up with PCP or ENT as needed Return or go to ER if patient has any new or worsening symptoms such as fever, chills, nausea, vomiting, sore throat, cough, abdominal pain, chest pain, changes in bowel or bladder habits, etc..Marland Kitchen

## 2019-11-29 ENCOUNTER — Ambulatory Visit
Admission: EM | Admit: 2019-11-29 | Discharge: 2019-11-29 | Disposition: A | Discharge status: Home / Self Care | Payer: Medicaid Other | Attending: Emergency Medicine | Admitting: Emergency Medicine

## 2019-11-29 ENCOUNTER — Encounter: Payer: Self-pay | Admitting: Emergency Medicine

## 2019-11-29 ENCOUNTER — Other Ambulatory Visit: Payer: Self-pay

## 2019-11-29 DIAGNOSIS — J029 Acute pharyngitis, unspecified: Secondary | ICD-10-CM | POA: Insufficient documentation

## 2019-11-29 LAB — POCT RAPID STREP A (OFFICE): Rapid Strep A Screen: NEGATIVE

## 2019-11-29 MED ORDER — LIDOCAINE VISCOUS HCL 2 % MT SOLN
15.0000 mL | OROMUCOSAL | 0 refills | Status: DC | PRN
Start: 2019-11-29 — End: 2020-03-20

## 2019-11-29 NOTE — ED Provider Notes (Signed)
[place]   [id number] 11/29/19 Arrival Time: 1909  XK:GYJE THROAT  SUBJECTIVE: History from: patient.  Teresa Daniel is a 24 y.o. female who presents to the urgent care for complaint swollen tonsil for the past 3 weeks.  Denies sick exposure to strep, flu or mono, or precipitating event.  Has tried OTC medication without relief.  Symptoms are made worse with swallowing, but tolerating liquids and own secretions without difficulty.  Reports/ denies previous symptoms in the past.   Denies chills, fever, nausea, vomiting, diarrhea   ROS: As per HPI.  All other pertinent ROS negative.     Past Medical History:  Diagnosis Date   ADHD (attention deficit hyperactivity disorder)    Asthma    Back pain    Dysmenorrhea in the adolescent    History reviewed. No pertinent surgical history. Allergies  Allergen Reactions   Codeine Itching and Rash   Sulfa Antibiotics Itching and Rash   Sulfasalazine Itching and Rash   No current facility-administered medications on file prior to encounter.   Current Outpatient Medications on File Prior to Encounter  Medication Sig Dispense Refill   acetaminophen (TYLENOL) 325 MG tablet Take 2 tablets (650 mg total) by mouth every 6 (six) hours as needed. 30 tablet 0   fluconazole (DIFLUCAN) 200 MG tablet Take one dose by mouth, wait 72 hours, and then take second dose by mouth 2 tablet 0   fluticasone (FLONASE) 50 MCG/ACT nasal spray Place 2 sprays into both nostrils daily. 16 g 6   ibuprofen (ADVIL,MOTRIN) 600 MG tablet Take 1 tablet (600 mg total) by mouth every 6 (six) hours as needed. Take with food 90 tablet 1   loratadine (CLARITIN) 10 MG tablet Take 1 tablet (10 mg total) by mouth daily. 30 tablet 11   [DISCONTINUED] gabapentin (NEURONTIN) 100 MG capsule Take 300 mg by mouth 3 (three) times daily.     [DISCONTINUED] medroxyPROGESTERone (DEPO-PROVERA) 150 MG/ML injection INJECT 1 ML INTO THE MUSCLE EVERY 3 MONTHS 1 mL 1    Social History   Socioeconomic History   Marital status: Single    Spouse name: Not on file   Number of children: Not on file   Years of education: Not on file   Highest education level: Not on file  Occupational History   Not on file  Tobacco Use   Smoking status: Never Smoker   Smokeless tobacco: Never Used  Substance and Sexual Activity   Alcohol use: No   Drug use: No   Sexual activity: Not on file  Other Topics Concern   Not on file  Social History Narrative   Not on file   Social Determinants of Health   Financial Resource Strain:    Difficulty of Paying Living Expenses: Not on file  Food Insecurity:    Worried About Running Out of Food in the Last Year: Not on file   Ran Out of Food in the Last Year: Not on file  Transportation Needs:    Lack of Transportation (Medical): Not on file   Lack of Transportation (Non-Medical): Not on file  Physical Activity:    Days of Exercise per Week: Not on file   Minutes of Exercise per Session: Not on file  Stress:    Feeling of Stress : Not on file  Social Connections:    Frequency of Communication with Friends and Family: Not on file   Frequency of Social Gatherings with Friends and Family: Not on file   Attends  Religious Services: Not on file   Active Member of Clubs or Organizations: Not on file   Attends Club or Organization Meetings: Not on file   Marital Status: Not on file  Intimate Partner Violence:    Fear of Current or Ex-Partner: Not on file   Emotionally Abused: Not on file   Physically Abused: Not on file   Sexually Abused: Not on file   Family History  Problem Relation Age of Onset   Hypertension Mother     OBJECTIVE:  Vitals:   11/29/19 2020 11/29/19 2021  BP:  129/87  Pulse:  90  Resp:  18  Temp:  98.2 F (36.8 C)  TempSrc:  Oral  SpO2:  98%  Weight: 160 lb (72.6 kg)   Height: 5\' 6"  (1.676 m)      General appearance: alert; appears fatigued, but nontoxic,  speaking in full sentences and managing own secretions HEENT: NCAT; Ears: EACs clear, TMs pearly gray with visible cone of light, without erythema; Eyes: PERRL, EOMI grossly; Nose: no obvious rhinorrhea; Throat: oropharynx clear, tonsils 1+ and mildly erythematous without white tonsillar exudates, uvula midline Neck: supple without LAD Lungs: CTA bilaterally without adventitious breath sounds; cough absent Heart: regular rate and rhythm.  Radial pulses 2+ symmetrical bilaterally Skin: warm and dry Psychological: alert and cooperative; normal mood and affect  LABS: No results found for this or any previous visit (from the past 24 hour(s)).   ASSESSMENT & PLAN:  1. Sore throat     Meds ordered this encounter  Medications   lidocaine (XYLOCAINE) 2 % solution    Sig: Use as directed 15 mLs in the mouth or throat as needed for mouth pain.    Dispense:  100 mL    Refill:  0    Discharge instructions  Strep test negative, will send out for culture and we will call you with Abnormal results Declines test for mono at this time Get plenty of rest and push fluids Viscous lidocaine prescribed.  This is an oral solution you can swish, and gargle as needed for symptomatic relief of sore throat.  Do not exceed 8 doses in a 24 hour period.  Do not use prior to eating, as this will numb your entire mouth.   Drink warm or cool liquids, use throat lozenges, or popsicles to help alleviate symptoms Take OTC ibuprofen or tylenol as needed for pain Follow up with PCP if symptoms persists Return or go to ER if patient has any new or worsening symptoms such as fever, chills, nausea, vomiting, worsening sore throat, cough, abdominal pain, chest pain, changes in bowel or bladder habits, etc...  Reviewed expectations re: course of current medical issues. Questions answered. Outlined signs and symptoms indicating need for more acute intervention. Patient verbalized understanding. After Visit Summary  given.     Note: This document was prepared using Dragon voice recognition software and may include unintentional dictation errors.    Emerson Monte, FNP 11/29/19 2055

## 2019-11-29 NOTE — Discharge Instructions (Addendum)
Strep test negative, will send out for culture and we will call you with abnormal results Declines test for mono at this time Get plenty of rest and push fluids Viscous lidocaine prescribed.  This is an oral solution you can swish, and gargle as needed for symptomatic relief of sore throat.  Do not exceed 8 doses in a 24 hour period.  Do not use prior to eating, as this will numb your entire mouth.   Drink warm or cool liquids, use throat lozenges, or popsicles to help alleviate symptoms Take OTC ibuprofen or tylenol as needed for pain Follow up with PCP if symptoms persists Return or go to ER if patient has any new or worsening symptoms such as fever, chills, nausea, vomiting, worsening sore throat, cough, abdominal pain, chest pain, changes in bowel or bladder habits, etc..Marland Kitchen

## 2019-11-29 NOTE — ED Triage Notes (Signed)
Reports white patches on her tonsils x 3 weeks. Denies any pain.

## 2019-12-03 LAB — CULTURE, GROUP A STREP (THRC)

## 2019-12-08 ENCOUNTER — Other Ambulatory Visit: Payer: Self-pay

## 2019-12-08 ENCOUNTER — Other Ambulatory Visit: Payer: Medicaid Other

## 2019-12-08 DIAGNOSIS — Z20822 Contact with and (suspected) exposure to covid-19: Secondary | ICD-10-CM

## 2019-12-10 LAB — SARS-COV-2, NAA 2 DAY TAT

## 2019-12-10 LAB — NOVEL CORONAVIRUS, NAA: SARS-CoV-2, NAA: NOT DETECTED

## 2020-03-20 ENCOUNTER — Other Ambulatory Visit (HOSPITAL_COMMUNITY)
Admission: RE | Admit: 2020-03-20 | Discharge: 2020-03-20 | Disposition: A | Discharge status: Home / Self Care | Payer: Medicaid Other | Source: Ambulatory Visit | Attending: Family Medicine | Admitting: Family Medicine

## 2020-03-20 ENCOUNTER — Other Ambulatory Visit: Payer: Self-pay

## 2020-03-20 ENCOUNTER — Emergency Department
Admission: EM | Admit: 2020-03-20 | Discharge: 2020-03-20 | Disposition: A | Discharge status: Home / Self Care | Payer: Self-pay | Source: Home / Self Care | Attending: Internal Medicine | Admitting: Internal Medicine

## 2020-03-20 DIAGNOSIS — N760 Acute vaginitis: Secondary | ICD-10-CM | POA: Diagnosis present

## 2020-03-20 LAB — POCT URINALYSIS DIP (MANUAL ENTRY)
Bilirubin, UA: NEGATIVE
Blood, UA: NEGATIVE
Glucose, UA: NEGATIVE mg/dL
Nitrite, UA: NEGATIVE
Protein Ur, POC: 30 mg/dL — AB
Spec Grav, UA: >=1.03 — AB (ref 1.010–1.025)
Urobilinogen, UA: 1 E.U./dL
pH, UA: 5.5 (ref 5.0–8.0)

## 2020-03-20 LAB — POCT URINE PREGNANCY: Preg Test, Ur: NEGATIVE

## 2020-03-20 MED ORDER — METRONIDAZOLE 500 MG PO TABS
500.0000 mg | ORAL_TABLET | Freq: Two times a day (BID) | ORAL | 0 refills | Status: DC
Start: 2020-03-20 — End: 2020-06-13

## 2020-03-20 NOTE — Discharge Instructions (Addendum)
Please avoid sexual intercourse until we know the results of the STD screen is available. Take medications as directed We will call you with recommendations if labs are abnormal Please do not drink alcohol while she take these medications.

## 2020-03-20 NOTE — ED Triage Notes (Signed)
Patient presents to Urgent Care with complaints of vaginal discharge that is green since about 6 days ago. Patient reports the area is inflamed as well, thinks she might have BV. Pt has been having unprotected intercourse.

## 2020-03-21 ENCOUNTER — Telehealth (HOSPITAL_COMMUNITY): Payer: Self-pay | Admitting: Emergency Medicine

## 2020-03-21 LAB — CERVICOVAGINAL ANCILLARY ONLY
Bacterial Vaginitis (gardnerella): POSITIVE — AB
Candida Glabrata: NEGATIVE
Candida Vaginitis: POSITIVE — AB
Chlamydia: NEGATIVE
Comment: NEGATIVE
Comment: NEGATIVE
Comment: NEGATIVE
Comment: NEGATIVE
Comment: NEGATIVE
Comment: NORMAL
Neisseria Gonorrhea: NEGATIVE
Trichomonas: NEGATIVE

## 2020-03-21 MED ORDER — FLUCONAZOLE 150 MG PO TABS
150.0000 mg | ORAL_TABLET | Freq: Once | ORAL | 0 refills | Status: AC
Start: 2020-03-21 — End: 2020-03-21

## 2020-03-25 NOTE — ED Provider Notes (Signed)
[facility]    CSN: [id number] Arrival date & time: 03/20/20  0840      History   Chief Complaint Chief Complaint  Patient presents with  . Vaginal Discharge    HPI Teresa Daniel is a 25 y.o. female comes to the urgent care with 6-day history of greenish vaginal discharge.  Patient says symptoms started insidiously and has been persistent.  She denies any itching.  No dysuria urgency or frequency.  No abdominal pain.  No fever or chills.  Patient is sexually active and engages in unprotected sexual intercourse with 1 partner.   Patient does not douche.  No nausea, vomiting.  HPI  Past Medical History:  Diagnosis Date  . ADHD (attention deficit hyperactivity disorder)   . Asthma   . Back pain   . Dysmenorrhea in the adolescent     Patient Active Problem List   Diagnosis Date Noted  . Thoracic back pain 08/01/2016  . Blurred vision 07/31/2016  . Numbness and tingling 07/31/2016  . Menstrual migraine 08/10/2014  . Dysmenorrhea 01/03/2014  . ADHD (attention deficit hyperactivity disorder) 06/09/2012  . Asthma, chronic 06/09/2012    History reviewed. No pertinent surgical history.  OB History   No obstetric history on file.      Home Medications    Prior to Admission medications   Medication Sig Start Date End Date Taking? Authorizing Provider  metroNIDAZOLE (FLAGYL) 500 MG tablet Take 1 tablet (500 mg total) by mouth 2 (two) times daily. 03/20/20  Yes Lizmary Nader, Britta Mccreedy, MD  fluticasone (FLONASE) 50 MCG/ACT nasal spray Place 2 sprays into both nostrils daily. 06/15/15 03/20/20  Lurene Shadow, MD  gabapentin (NEURONTIN) 100 MG capsule Take 300 mg by mouth 3 (three) times daily.  09/30/19  [provider]  loratadine (CLARITIN) 10 MG tablet Take 1 tablet (10 mg total) by mouth daily. 06/15/15 03/20/20  Lurene Shadow, MD  medroxyPROGESTERone (DEPO-PROVERA) 150 MG/ML injection INJECT 1 ML INTO THE MUSCLE EVERY 3 MONTHS  08/20/16 09/30/19  McDonell, Alfredia Client, MD    Family History Family History  Problem Relation Age of Onset  . Hypertension Mother     Social History Social History   Tobacco Use  . Smoking status: Never Smoker  . Smokeless tobacco: Never Used  Substance Use Topics  . Alcohol use: No  . Drug use: No     Allergies   Codeine, Sulfa antibiotics, and Sulfasalazine   Review of Systems Review of Systems  Constitutional: Negative.   Gastrointestinal: Negative.   Genitourinary: Positive for vaginal discharge. Negative for dysuria, frequency, urgency, vaginal bleeding and vaginal pain.  Neurological: Negative.      Physical Exam Triage Vital Signs ED Triage Vitals  Enc Vitals Group     BP 03/20/20 0854 118/77     Pulse Rate 03/20/20 0854 85     Resp 03/20/20 0854 14     Temp 03/20/20 0854 98.8 F (37.1 C)     Temp Source 03/20/20 0854 Oral     SpO2 03/20/20 0854 99 %     Weight --      Height --      Head Circumference --      Peak Flow --      Pain Score 03/20/20 0852 0     Pain Loc --      Pain Edu? --      Excl. in GC? --    No data found.  Updated Vital Signs BP 118/77 (BP  Location: Left Arm)   Pulse 85   Temp 98.8 F (37.1 C) (Oral)   Resp 14   SpO2 99%   Visual Acuity Right Eye Distance:   Left Eye Distance:   Bilateral Distance:    Right Eye Near:   Left Eye Near:    Bilateral Near:     Physical Exam Vitals and nursing note reviewed.  Constitutional:      General: She is not in acute distress.    Appearance: Normal appearance. She is not ill-appearing.  Cardiovascular:     Rate and Rhythm: Normal rate and regular rhythm.  Pulmonary:     Effort: Pulmonary effort is normal.     Breath sounds: Normal breath sounds.  Abdominal:     General: Bowel sounds are normal. There is no distension.     Tenderness: There is no abdominal tenderness.     Hernia: No hernia is present.  Neurological:     Mental Status: She is alert.      UC Treatments  / Results  Labs (all labs ordered are listed, but only abnormal results are displayed) Labs Reviewed  POCT URINALYSIS DIP (MANUAL ENTRY) - Abnormal; Notable for the following components:      Result Value   Clarity, UA cloudy (*)    Ketones, POC UA trace (5) (*)    Spec Grav, UA >=1.030 (*)    Protein Ur, POC =30 (*)    Leukocytes, UA Small (1+) (*)    All other components within normal limits  CERVICOVAGINAL ANCILLARY ONLY - Abnormal; Notable for the following components:   Bacterial Vaginitis (gardnerella) Positive (*)    Candida Vaginitis Positive (*)    All other components within normal limits  POCT URINE PREGNANCY    EKG   Radiology No results found.  Procedures Procedures (including critical care time)  Medications Ordered in UC Medications - No data to display  Initial Impression / Assessment and Plan / UC Course  I have reviewed the triage vital signs and the nursing notes.  Pertinent labs & imaging results that were available during my care of the patient were reviewed by me and considered in my medical decision making (see chart for details).     1.  Acute vaginitis: Cervical vaginal swab for bacterial vaginosis, yeast, GC/chlamydia Flagyl 500 mg twice daily for 7 days Precautions regarding alcohol use is given and patient verbalized understanding I did put order for urinalysis because patient has no symptoms concerning for UTI. Return precautions given  Final Clinical Impressions(s) / UC Diagnoses   Final diagnoses:  Acute vaginitis     Discharge Instructions     Please avoid sexual intercourse until we know the results of the STD screen is available. Take medications as directed We will call you with recommendations if labs are abnormal Please do not drink alcohol while she take these medications.   ED Prescriptions    Medication Sig Dispense Auth. Provider   metroNIDAZOLE (FLAGYL) 500 MG tablet Take 1 tablet (500 mg total) by mouth 2 (two)  times daily. 14 tablet Sims Laday, Myrene Galas, MD     PDMP not reviewed this encounter.   Chase Picket, MD 03/25/20 0930

## 2020-04-12 ENCOUNTER — Telehealth: Payer: Self-pay | Admitting: Emergency Medicine

## 2020-04-12 NOTE — Telephone Encounter (Signed)
Call from [facility] regarding a refill on medication for BV pt states I had intercourse while I was taking the medicine. Last prescription was on 03/20/20. Pt states she does not have an OB/GYN or a PCP. RN spoke w/ provider Leeroy Cha) here today - unable to provide a refill w/o a follow up visit

## 2020-04-14 ENCOUNTER — Emergency Department
Admission: EM | Admit: 2020-04-14 | Discharge: 2020-04-14 | Disposition: A | Discharge status: Home / Self Care | Payer: Medicaid Other | Source: Home / Self Care

## 2020-04-14 ENCOUNTER — Other Ambulatory Visit: Payer: Self-pay

## 2020-04-14 ENCOUNTER — Other Ambulatory Visit (HOSPITAL_COMMUNITY)
Admission: RE | Admit: 2020-04-14 | Discharge: 2020-04-14 | Disposition: A | Discharge status: Home / Self Care | Payer: Medicaid Other | Source: Ambulatory Visit | Attending: Family Medicine | Admitting: Family Medicine

## 2020-04-14 ENCOUNTER — Encounter: Payer: Self-pay | Admitting: Emergency Medicine

## 2020-04-14 DIAGNOSIS — N898 Other specified noninflammatory disorders of vagina: Secondary | ICD-10-CM | POA: Insufficient documentation

## 2020-04-14 MED ORDER — FLUCONAZOLE 200 MG PO TABS: 200.0000 mg | ORAL_TABLET | Freq: Once | ORAL | 0 refills | Status: AC

## 2020-04-14 NOTE — ED Provider Notes (Signed)
[name]    CSN: [id number] Arrival date & time: 04/14/20  1233      History   Chief Complaint Chief Complaint  Patient presents with  . Vaginal Discharge    HPI Teresa Daniel is a 25 y.o. female.   HPI Teresa Daniel is a 25 y.o. female presenting to UC with c/o vaginal discharge with bad odor that started this week. She was tx for BV recently and is concerned symptoms returned. She reports having intercourse while still taking the medication.  She does report vaginal itching as well. She tested positive for yeast recently as well. She did take both medications as prescribed. Denies fever, chills, n/v/d. Denies abdominal pain, back pain or dysuria.    Past Medical History:  Diagnosis Date  . ADHD (attention deficit hyperactivity disorder)   . Asthma   . Back pain   . Dysmenorrhea in the adolescent     Patient Active Problem List   Diagnosis Date Noted  . Thoracic back pain 08/01/2016  . Blurred vision 07/31/2016  . Numbness and tingling 07/31/2016  . Menstrual migraine 08/10/2014  . Dysmenorrhea 01/03/2014  . ADHD (attention deficit hyperactivity disorder) 06/09/2012  . Asthma, chronic 06/09/2012    History reviewed. No pertinent surgical history.  OB History   No obstetric history on file.      Home Medications    Prior to Admission medications   Medication Sig Start Date End Date Taking? Authorizing Provider  metroNIDAZOLE (FLAGYL) 500 MG tablet Take 1 tablet (500 mg total) by mouth 2 (two) times daily. Patient not taking: Reported on 04/14/2020 03/20/20   Chase Picket, MD  fluticasone [facility]) 50 MCG/ACT nasal spray Place 2 sprays into both nostrils daily. 06/15/15 03/20/20  Evern Core, MD  gabapentin (NEURONTIN) 100 MG capsule Take 300 mg by mouth 3 (three) times daily.  09/30/19  [provider]  loratadine (CLARITIN) 10 MG tablet Take 1 tablet (10 mg total) by mouth daily. 06/15/15 03/20/20   Evern Core, MD  medroxyPROGESTERone (DEPO-PROVERA) 150 MG/ML injection INJECT 1 ML INTO THE MUSCLE EVERY 3 MONTHS 08/20/16 09/30/19  McDonell, Kyra Manges, MD    Family History Family History  Problem Relation Age of Onset  . Hypertension Mother   . Healthy Brother     Social History Social History   Tobacco Use  . Smoking status: Never Smoker  . Smokeless tobacco: Never Used  Substance Use Topics  . Alcohol use: No  . Drug use: No     Allergies   Codeine, Sulfa antibiotics, and Sulfasalazine   Review of Systems Review of Systems  Constitutional: Negative for chills and fever.  Gastrointestinal: Negative for abdominal pain, diarrhea, nausea and vomiting.  Genitourinary: Positive for vaginal discharge and vaginal pain (itching, irritation). Negative for dysuria, frequency, genital sores, hematuria and vaginal bleeding.  Musculoskeletal: Negative for back pain.     Physical Exam Triage Vital Signs ED Triage Vitals  Enc Vitals Group     BP 04/14/20 1245 119/83     Pulse Rate 04/14/20 1245 86     Resp 04/14/20 1245 15     Temp 04/14/20 1245 98.7 F (37.1 C)     Temp Source 04/14/20 1245 Oral     SpO2 04/14/20 1245 98 %     Weight 04/14/20 1250 150 lb (68 kg)     Height 04/14/20 1250 5\' 6"  (1.676 m)     Head Circumference --      Peak Flow --  Pain Score 04/14/20 1250 0     Pain Loc --      Pain Edu? --      Excl. in [place]? --    No data found.  Updated Vital Signs BP 119/83 (BP Location: Left Arm)   Pulse 86   Temp 98.7 F (37.1 C) (Oral)   Resp 15   Ht 5\' 6"  (1.676 m)   Wt 150 lb (68 kg)   LMP 03/30/2020 (Exact Date)   SpO2 98%   BMI 24.21 kg/m   Visual Acuity Right Eye Distance:   Left Eye Distance:   Bilateral Distance:    Right Eye Near:   Left Eye Near:    Bilateral Near:     Physical Exam Vitals and nursing note reviewed.  Constitutional:      General: She is not in acute distress.    Appearance: Normal appearance. She is  well-developed and well-nourished. She is not ill-appearing, toxic-appearing or diaphoretic.  HENT:     Head: Normocephalic and atraumatic.  Eyes:     Extraocular Movements: EOM normal.  Cardiovascular:     Rate and Rhythm: Normal rate and regular rhythm.  Pulmonary:     Effort: Pulmonary effort is normal. No respiratory distress.     Breath sounds: Normal breath sounds.  Abdominal:     General: There is no distension.     Palpations: Abdomen is soft.     Tenderness: There is no abdominal tenderness. There is no right CVA tenderness or left CVA tenderness.  Genitourinary:    Comments: Deferred. Pt provided vaginal self-swab Musculoskeletal:        General: Normal range of motion.     Cervical back: Normal range of motion.  Skin:    General: Skin is warm and dry.  Neurological:     Mental Status: She is alert and oriented to person, place, and time.  Psychiatric:        Mood and Affect: Mood and affect normal.        Behavior: Behavior normal.      UC Treatments / Results  Labs (all labs ordered are listed, but only abnormal results are displayed) Labs Reviewed  CERVICOVAGINAL ANCILLARY ONLY    EKG   Radiology No results found.  Procedures Procedures (including critical care time)  Medications Ordered in UC Medications - No data to display  Initial Impression / Assessment and Plan / UC Course  I have reviewed the triage vital signs and the nursing notes.  Pertinent labs & imaging results that were available during my care of the patient were reviewed by me and considered in my medical decision making (see chart for details).    Pt declined pelvic exam, vaginal self swab performed Vaginal swab sent to lab Based on reported vaginal itching and discharge will tx with diflucan Encouraged f/u with GYN as needed  Final Clinical Impressions(s) / UC Diagnoses   Final diagnoses:  Vaginal discharge  Vaginal itching     Discharge Instructions      You will  be notified if any additional medication is needed.    ED Prescriptions    Medication Sig Dispense Auth. Provider   fluconazole (DIFLUCAN) 200 MG tablet Take 1 tablet (200 mg total) by mouth once for 1 dose. May repeat in 3 days if still having symptoms. 2 tablet Noe Gens, PA-C     PDMP not reviewed this encounter.   Noe Gens, [place] 04/15/20 1334

## 2020-04-14 NOTE — Discharge Instructions (Signed)
  You will be notified if any additional medication is needed.

## 2020-04-14 NOTE — ED Triage Notes (Signed)
Treated recently for BV - symptoms returned this week Would like to be rechecked  Vaginal discharge with a bad odor pe rpt

## 2020-04-16 ENCOUNTER — Telehealth: Payer: Self-pay | Admitting: Emergency Medicine

## 2020-04-16 NOTE — Telephone Encounter (Signed)
Call from pt regarding STD test results - pt recently treated for BV- RN explained that labs for STD testing are only picked up Mon-Fri so her results would not be back until Tuesday or Wed. Same RN also informed pt of the delay in lab results at the most recent visit to [facility]. Pt asked if  medication can be sent in for BV to [facility] in [facility] before results are back. RN stated this was not the normal protocol but she would talk to the provider here today.

## 2020-04-17 LAB — CERVICOVAGINAL ANCILLARY ONLY
Bacterial Vaginitis (gardnerella): NEGATIVE
Candida Glabrata: NEGATIVE
Candida Vaginitis: POSITIVE — AB
Chlamydia: NEGATIVE
Comment: NEGATIVE
Comment: NEGATIVE
Comment: NEGATIVE
Comment: NEGATIVE
Comment: NEGATIVE
Comment: NORMAL
Neisseria Gonorrhea: NEGATIVE
Trichomonas: NEGATIVE

## 2020-05-15 ENCOUNTER — Encounter: Payer: Medicaid Other | Admitting: Certified Nurse Midwife

## 2020-06-13 ENCOUNTER — Other Ambulatory Visit: Payer: Self-pay

## 2020-06-13 ENCOUNTER — Emergency Department (INDEPENDENT_AMBULATORY_CARE_PROVIDER_SITE_OTHER)
Admission: EM | Admit: 2020-06-13 | Discharge: 2020-06-13 | Disposition: A | Discharge status: Home / Self Care | Payer: BLUE CROSS/BLUE SHIELD | Source: Home / Self Care

## 2020-06-13 ENCOUNTER — Other Ambulatory Visit (HOSPITAL_COMMUNITY)
Admission: RE | Admit: 2020-06-13 | Discharge: 2020-06-13 | Disposition: A | Discharge status: Home / Self Care | Payer: BLUE CROSS/BLUE SHIELD | Source: Ambulatory Visit | Attending: Family Medicine | Admitting: Family Medicine

## 2020-06-13 DIAGNOSIS — N760 Acute vaginitis: Secondary | ICD-10-CM | POA: Diagnosis not present

## 2020-06-13 LAB — POCT URINALYSIS DIP (MANUAL ENTRY)
Bilirubin, UA: NEGATIVE
Blood, UA: NEGATIVE
Glucose, UA: NEGATIVE mg/dL
Ketones, POC UA: NEGATIVE mg/dL
Leukocytes, UA: NEGATIVE
Nitrite, UA: NEGATIVE
Protein Ur, POC: 100 mg/dL — AB
Spec Grav, UA: >=1.03 — AB (ref 1.010–1.025)
Urobilinogen, UA: 0.2 E.U./dL
pH, UA: 5.5 (ref 5.0–8.0)

## 2020-06-13 MED ORDER — FLUCONAZOLE 200 MG PO TABS: 200.0000 mg | ORAL_TABLET | Freq: Once | ORAL | 0 refills | Status: AC

## 2020-06-13 MED ORDER — METRONIDAZOLE 500 MG PO TABS: 500.0000 mg | ORAL_TABLET | Freq: Two times a day (BID) | ORAL | 0 refills | Status: DC

## 2020-06-13 NOTE — ED Triage Notes (Signed)
Pt c/o vaginal itching x 3 weeks. Also strong odor. Hx of BV.

## 2020-06-13 NOTE — Discharge Instructions (Addendum)
I have sent in Metronidazole for you to take twice a day for 7 days.  Do not drink alcohol with this medication, it will make you very sick.  I have sent in fluconazole in case of yeast. Take one tablet at the onset of symptoms. If symptoms are still present in 3 days, take the second tablet.   May use Boric Acid suppositories daily for 30 days. Then may use 2-3 times per week for maintenance.  Follow up with PCP or GYN as needed.

## 2020-06-14 NOTE — ED Provider Notes (Signed)
[place]   [id number] 06/13/20 Arrival Time: 1930   CC: VAGINAL DISCHARGE  SUBJECTIVE:  Lavern Crimi Carl is a 25 y.o. female who presents with complaints of  gradual vaginal discharge that began 3 weeks ago. Reports vaginal itching and irritation, discharge and odor. Has hx BV. Reports that she had the same symptoms about 3 weeks ago and then had a period. Reports that symptoms cleared with her period. Has been having symptoms again for the last week. Denies concern for STD. Denies a precipitating event, recent sexual encounter or recent antibiotic use. Patient is sexually active. Describes discharge as thin and more than usual. Has not tried OTC medications without relief. Symptoms are aggravated with sexual activity. Denies fever, chills, nausea, vomiting, abdominal or pelvic pain, urinary symptoms, vaginal bleeding, dyspareunia, vaginal rashes or lesions.   Patient's last menstrual period was 05/23/2020 (exact date).   ROS: As per HPI.  All other pertinent ROS negative.     Past Medical History:  Diagnosis Date  . ADHD (attention deficit hyperactivity disorder)   . Asthma   . Back pain   . Dysmenorrhea in the adolescent    History reviewed. No pertinent surgical history. Allergies  Allergen Reactions  . Codeine Itching and Rash  . Sulfa Antibiotics Itching and Rash  . Sulfasalazine Itching and Rash   No current facility-administered medications on file prior to encounter.   Current Outpatient Medications on File Prior to Encounter  Medication Sig Dispense Refill  . [DISCONTINUED] fluticasone (FLONASE) 50 MCG/ACT nasal spray Place 2 sprays into both nostrils daily. 16 g 6  . [DISCONTINUED] gabapentin (NEURONTIN) 100 MG capsule Take 300 mg by mouth 3 (three) times daily.    . [DISCONTINUED] loratadine (CLARITIN) 10 MG tablet Take 1 tablet (10 mg total) by mouth daily. 30 tablet 11  . [DISCONTINUED] medroxyPROGESTERone (DEPO-PROVERA) 150 MG/ML injection INJECT 1  ML INTO THE MUSCLE EVERY 3 MONTHS 1 mL 1    Social History   Socioeconomic History  . Marital status: Single    Spouse name: Not on file  . Number of children: Not on file  . Years of education: Not on file  . Highest education level: Not on file  Occupational History  . Not on file  Tobacco Use  . Smoking status: Never Smoker  . Smokeless tobacco: Never Used  Substance and Sexual Activity  . Alcohol use: No  . Drug use: No  . Sexual activity: Not on file  Other Topics Concern  . Not on file  Social History Narrative  . Not on file   Social Determinants of Health   Financial Resource Strain: Not on file  Food Insecurity: Not on file  Transportation Needs: Not on file  Physical Activity: Not on file  Stress: Not on file  Social Connections: Not on file  Intimate Partner Violence: Not on file   Family History  Problem Relation Age of Onset  . Hypertension Mother   . Healthy Brother     OBJECTIVE:  Vitals:   06/13/20 1942  BP: (!) 132/93  Pulse: 93  Resp: 17  Temp: 98.8 F (37.1 C)  TempSrc: Oral  SpO2: 99%     General appearance: Alert, NAD, appears stated age Head: NCAT Throat: lips, mucosa, and tongue normal; teeth and gums normal Lungs: CTA bilaterally without adventitious breath sounds Heart: regular rate and rhythm.  Radial pulses 2+ symmetrical bilaterally Back: no CVA tenderness Abdomen: soft, non-tender; bowel sounds normal; no masses or organomegaly; no  guarding or rebound tenderness GU: deferred Skin: warm and dry Psychological:  Alert and cooperative. Normal mood and affect.  LABS:  Results for orders placed or performed during the hospital encounter of 06/13/20  POCT urinalysis dipstick  Result Value Ref Range   Color, UA yellow yellow   Clarity, UA clear clear   Glucose, UA negative negative mg/dL   Bilirubin, UA negative negative   Ketones, POC UA negative negative mg/dL   Spec Grav, UA >=1.030 (A) 1.010 - 1.025   Blood, UA  negative negative   pH, UA 5.5 5.0 - 8.0   Protein Ur, POC =100 (A) negative mg/dL   Urobilinogen, UA 0.2 0.2 or 1.0 E.U./dL   Nitrite, UA Negative Negative   Leukocytes, UA Negative Negative    Labs Reviewed  POCT URINALYSIS DIP (MANUAL ENTRY) - Abnormal; Notable for the following components:      Result Value   Spec Grav, UA >=1.030 (*)    Protein Ur, POC =100 (*)    All other components within normal limits  CERVICOVAGINAL ANCILLARY ONLY    ASSESSMENT & PLAN:  1. Acute vaginitis     Meds ordered this encounter  Medications  . metroNIDAZOLE (FLAGYL) 500 MG tablet    Sig: Take 1 tablet (500 mg total) by mouth 2 (two) times daily.    Dispense:  14 tablet    Refill:  0    Order Specific Question:   Supervising Provider    Answer:   Chase Picket [id number]  . fluconazole (DIFLUCAN) 200 MG tablet    Sig: Take 1 tablet (200 mg total) by mouth once for 1 dose.    Dispense:  2 tablet    Refill:  0    Order Specific Question:   Supervising Provider    Answer:   Chase Picket [[id number]]    Pending: Labs Reviewed  POCT URINALYSIS DIP (MANUAL ENTRY) - Abnormal; Notable for the following components:      Result Value   Spec Grav, UA >=1.030 (*)    Protein Ur, POC =100 (*)    All other components within normal limits  CERVICOVAGINAL ANCILLARY ONLY   UA negative for infection today Cytology self-swab obtained.   We will follow up with you regarding abnormal results Declines HIV/ syphilis testing today Prescribed metronidazole 500 mg twice daily for 7 days (do not take while consuming alcohol and/or if breastfeeding) Prescribed diflucan 200 mg once daily and then second dose 72 hours later Take medications as prescribed and to completion If tests results are positive, please abstain from sexual activity until you and your partner(s) have been treated Follow up with PCP or [facility] if symptoms persists Return here or go to ER if you have any new or worsening  symptoms fever, chills, nausea, vomiting, abdominal or pelvic pain, painful intercourse, persistent symptoms despite treatment Reviewed expectations re: course of current medical issues. Questions answered. Outlined signs and symptoms indicating need for more acute intervention. Patient verbalized understanding. After Visit Summary given.       Faustino Congress, NP 06/14/20 [phone]

## 2020-06-15 LAB — CERVICOVAGINAL ANCILLARY ONLY
Bacterial Vaginitis (gardnerella): NEGATIVE
Candida Glabrata: NEGATIVE
Candida Vaginitis: POSITIVE — AB
Chlamydia: NEGATIVE
Comment: NEGATIVE
Comment: NEGATIVE
Comment: NEGATIVE
Comment: NEGATIVE
Comment: NEGATIVE
Comment: NORMAL
Neisseria Gonorrhea: NEGATIVE
Trichomonas: NEGATIVE

## 2020-07-25 ENCOUNTER — Emergency Department (INDEPENDENT_AMBULATORY_CARE_PROVIDER_SITE_OTHER)
Admission: EM | Admit: 2020-07-25 | Discharge: 2020-07-25 | Disposition: A | Discharge status: Home / Self Care | Payer: BLUE CROSS/BLUE SHIELD | Source: Home / Self Care

## 2020-07-25 ENCOUNTER — Other Ambulatory Visit (HOSPITAL_COMMUNITY)
Admission: RE | Admit: 2020-07-25 | Discharge: 2020-07-25 | Disposition: A | Discharge status: Home / Self Care | Payer: BLUE CROSS/BLUE SHIELD | Source: Ambulatory Visit | Attending: Family Medicine | Admitting: Family Medicine

## 2020-07-25 ENCOUNTER — Other Ambulatory Visit: Payer: Self-pay

## 2020-07-25 DIAGNOSIS — N760 Acute vaginitis: Secondary | ICD-10-CM

## 2020-07-25 DIAGNOSIS — B349 Viral infection, unspecified: Secondary | ICD-10-CM | POA: Diagnosis not present

## 2020-07-25 DIAGNOSIS — U071 COVID-19: Secondary | ICD-10-CM | POA: Insufficient documentation

## 2020-07-25 DIAGNOSIS — H66001 Acute suppurative otitis media without spontaneous rupture of ear drum, right ear: Secondary | ICD-10-CM

## 2020-07-25 LAB — POCT URINALYSIS DIP (MANUAL ENTRY)
Bilirubin, UA: NEGATIVE
Blood, UA: NEGATIVE
Glucose, UA: NEGATIVE mg/dL
Nitrite, UA: NEGATIVE
Protein Ur, POC: NEGATIVE mg/dL
Spec Grav, UA: 1.025 (ref 1.010–1.025)
Urobilinogen, UA: 1 E.U./dL
pH, UA: 7 (ref 5.0–8.0)

## 2020-07-25 MED ORDER — AMOXICILLIN-POT CLAVULANATE 875-125 MG PO TABS: 1.0000 | ORAL_TABLET | Freq: Two times a day (BID) | ORAL | 0 refills | Status: AC

## 2020-07-25 MED ORDER — IBUPROFEN 600 MG PO TABS
600.0000 mg | ORAL_TABLET | Freq: Four times a day (QID) | ORAL | 0 refills | Status: DC | PRN
Start: 2020-07-25 — End: 2023-07-02

## 2020-07-25 MED ORDER — ACETAMINOPHEN 325 MG PO TABS: 650.0000 mg | ORAL_TABLET | Freq: Once | ORAL | Status: DC

## 2020-07-25 NOTE — Discharge Instructions (Signed)
I have sent in Augmentin for you to take twice a day for 7 days.  Your urine looked okay in the office today. We will culture just to be sure there is no infection given your fever today.  Your vaginal swab will be back within 2 to 3 days. Do not have sex until results are back and negative, or until treatment is completed, whichever is longer.  Your COVID and Influenza tests are pending. You should self quarantine until the test results are back.    Take Tylenol or ibuprofen as needed for fever or discomfort. Rest and keep yourself hydrated.    Follow-up with your primary care provider if your symptoms are not improving.

## 2020-07-25 NOTE — ED Triage Notes (Signed)
Pt presents to Urgent Care with c/o vaginal discharge x 1 week and lower back pain w/ fever (101) today. Denies dysuria. Episode of nausea several days ago.

## 2020-07-25 NOTE — ED Provider Notes (Signed)
[place]   [id number] 07/25/20 Arrival Time: 0160   CC: COVID symptoms  SUBJECTIVE: History from: patient.  Teresa Daniel is a 25 y.o. female who presents with headache, backache, chills, fever, vaginal discharge for the last 2 days.  Also reports sinus pressure to the right side of her face.  Has history of BV and yeast.. Denies sick exposure to COVID, flu or strep. Denies recent travel. Has negative history of Covid. Has not completed Covid vaccines.  Has not completed flu vaccine. Has not taken OTC medications for this. There are no aggravating or alleviating factors. Denies previous symptoms in the past. Denies rhinorrhea, sore throat, SOB, wheezing, chest pain, nausea, changes in bowel or bladder habits.    ROS: As per HPI.  All other pertinent ROS negative.     Past Medical History:  Diagnosis Date  . ADHD (attention deficit hyperactivity disorder)   . Asthma   . Back pain   . Dysmenorrhea in the adolescent    History reviewed. No pertinent surgical history. Allergies  Allergen Reactions  . Codeine Itching and Rash  . Sulfa Antibiotics Itching and Rash  . Sulfasalazine Itching and Rash   No current facility-administered medications on file prior to encounter.   Current Outpatient Medications on File Prior to Encounter  Medication Sig Dispense Refill  . [DISCONTINUED] fluticasone (FLONASE) 50 MCG/ACT nasal spray Place 2 sprays into both nostrils daily. 16 g 6  . [DISCONTINUED] gabapentin (NEURONTIN) 100 MG capsule Take 300 mg by mouth 3 (three) times daily.    . [DISCONTINUED] loratadine (CLARITIN) 10 MG tablet Take 1 tablet (10 mg total) by mouth daily. 30 tablet 11  . [DISCONTINUED] medroxyPROGESTERone (DEPO-PROVERA) 150 MG/ML injection INJECT 1 ML INTO THE MUSCLE EVERY 3 MONTHS 1 mL 1   Social History   Socioeconomic History  . Marital status: Single    Spouse name: Not on file  . Number of children: Not on file  . Years of education: Not on file   . Highest education level: Not on file  Occupational History  . Not on file  Tobacco Use  . Smoking status: Never Smoker  . Smokeless tobacco: Never Used  Substance and Sexual Activity  . Alcohol use: No  . Drug use: No  . Sexual activity: Not on file  Other Topics Concern  . Not on file  Social History Narrative  . Not on file   Social Determinants of Health   Financial Resource Strain: Not on file  Food Insecurity: Not on file  Transportation Needs: Not on file  Physical Activity: Not on file  Stress: Not on file  Social Connections: Not on file  Intimate Partner Violence: Not on file   Family History  Problem Relation Age of Onset  . Hypertension Mother   . Healthy Brother     OBJECTIVE:  Vitals:   07/25/20 1529 07/25/20 1534  BP:  116/68  Pulse:  (!) 123  Resp:  20  Temp:  99.8 F (37.7 C)  TempSrc:  Oral  SpO2:  97%  Weight: 150 lb (68 kg)   Height: 5\' 6"  (1.676 m)      General appearance: alert; appears fatigued, but nontoxic; speaking in full sentences and tolerating own secretions HEENT: NCAT; Ears: EACs clear, L TM pearly gray, R TM erythematous, bulging with effusion; Eyes: PERRL.  EOM grossly intact. Sinuses: nontender; Nose: nares patent with clear rhinorrhea, Throat: oropharynx erythematous, cobblestoning present, tonsils non erythematous or enlarged, uvula midline  Neck: supple with LAD Lungs: unlabored respirations, symmetrical air entry; cough: absent; no respiratory distress; CTAB Heart: regular rate and rhythm.  Radial pulses 2+ symmetrical bilaterally Skin: warm and dry Psychological: alert and cooperative; normal mood and affect  LABS:  Results for orders placed or performed during the hospital encounter of 07/25/20 (from the past 24 hour(s))  POCT urinalysis dipstick     Status: Abnormal   Collection Time: 07/25/20  3:43 PM  Result Value Ref Range   Color, UA straw (A) yellow   Clarity, UA cloudy (A) clear   Glucose, UA negative  negative mg/dL   Bilirubin, UA negative negative   Ketones, POC UA small (15) (A) negative mg/dL   Spec Grav, UA 1.025 1.010 - 1.025   Blood, UA negative negative   pH, UA 7.0 5.0 - 8.0   Protein Ur, POC negative negative mg/dL   Urobilinogen, UA 1.0 0.2 or 1.0 E.U./dL   Nitrite, UA Negative Negative   Leukocytes, UA Trace (A) Negative     ASSESSMENT & PLAN:  1. Non-recurrent acute suppurative otitis media of right ear without spontaneous rupture of tympanic membrane   2. Acute vaginitis   3. Viral illness     Meds ordered this encounter  Medications  . acetaminophen (TYLENOL) tablet 650 mg  . amoxicillin-clavulanate (AUGMENTIN) 875-125 MG tablet    Sig: Take 1 tablet by mouth 2 (two) times daily for 7 days.    Dispense:  14 tablet    Refill:  0    Order Specific Question:   Supervising Provider    Answer:   Chase Picket [id number]  . ibuprofen (ADVIL) 600 MG tablet    Sig: Take 1 tablet (600 mg total) by mouth every 6 (six) hours as needed.    Dispense:  30 tablet    Refill:  0    Order Specific Question:   Supervising Provider    Answer:   Chase Picket [[id number]]   Tylenol given in office today for fever Augmentin 875 twice daily x7 days for right ear infection prescribed Ibuprofen 600 mg every 6 hours as needed for pain and fever prescribed Cytology swab obtained Will inform of abnormal results that require further treatment May continue vaginal health oral probiotic Continue supportive care at home COVID and flu testing ordered.  It will take between 2-3 days for test results. Someone will contact you regarding abnormal results.   Work note provided Patient should remain in quarantine until they have received Covid results.  If negative you may resume normal activities (go back to work/school) while practicing hand hygiene, social distance, and mask wearing.  If positive, patient should remain in quarantine for at least 5 days from symptom onset AND greater  than 72 hours after symptoms resolution (absence of fever without the use of fever-reducing medication and improvement in respiratory symptoms), whichever is longer Get plenty of rest and push fluids Use OTC zyrtec for nasal congestion, runny nose, and/or sore throat Use OTC flonase for nasal congestion and runny nose Use medications daily for symptom relief Use OTC medications like ibuprofen or tylenol as needed fever or pain Call or go to the ED if you have any new or worsening symptoms such as fever, worsening cough, shortness of breath, chest tightness, chest pain, turning blue, changes in mental status.  Reviewed expectations re: course of current medical issues. Questions answered. Outlined signs and symptoms indicating need for more acute intervention. Patient verbalized understanding. After Visit Summary given.  Faustino Congress, NP 07/25/20 1847

## 2020-07-26 ENCOUNTER — Telehealth (HOSPITAL_COMMUNITY): Payer: Self-pay | Admitting: Emergency Medicine

## 2020-07-26 LAB — CERVICOVAGINAL ANCILLARY ONLY
Bacterial Vaginitis (gardnerella): NEGATIVE
Candida Glabrata: NEGATIVE
Candida Vaginitis: POSITIVE — AB
Chlamydia: NEGATIVE
Comment: NEGATIVE
Comment: NEGATIVE
Comment: NEGATIVE
Comment: NEGATIVE
Comment: NEGATIVE
Comment: NORMAL
Neisseria Gonorrhea: NEGATIVE
Trichomonas: NEGATIVE

## 2020-07-26 MED ORDER — FLUCONAZOLE 150 MG PO TABS
150.0000 mg | ORAL_TABLET | Freq: Once | ORAL | 0 refills | Status: AC
Start: 2020-07-26 — End: 2020-07-26

## 2020-07-27 LAB — URINE CULTURE
MICRO NUMBER:: 11851621
SPECIMEN QUALITY:: ADEQUATE

## 2020-07-28 LAB — COVID-19, FLU A+B NAA
Influenza A, NAA: NOT DETECTED
Influenza B, NAA: NOT DETECTED
SARS-CoV-2, NAA: DETECTED — AB

## 2020-08-15 ENCOUNTER — Encounter: Payer: Self-pay | Admitting: Obstetrics and Gynecology

## 2020-08-15 ENCOUNTER — Other Ambulatory Visit: Payer: Self-pay

## 2020-08-15 ENCOUNTER — Ambulatory Visit (INDEPENDENT_AMBULATORY_CARE_PROVIDER_SITE_OTHER): Payer: BLUE CROSS/BLUE SHIELD | Admitting: Obstetrics and Gynecology

## 2020-08-15 VITALS — BP 124/74 | HR 77 | Resp 16 | Ht 66.0 in | Wt 154.0 lb

## 2020-08-15 DIAGNOSIS — N898 Other specified noninflammatory disorders of vagina: Secondary | ICD-10-CM | POA: Diagnosis not present

## 2020-08-15 DIAGNOSIS — Z23 Encounter for immunization: Secondary | ICD-10-CM | POA: Diagnosis not present

## 2020-08-15 NOTE — Progress Notes (Signed)
    GYNECOLOGY ENCOUNTER NOTE  History:     Teresa Daniel is a 25 y.o. G0P0000 female here to establish care with our office and for follow up for recurrent yeast infection.  She has been see with [facility] and has been treated for recurrent yeast. She only is prescribed 1 tablet for treatment and does not feel this is enough. She would like to be tested again today and treated with high dose medication. She continues to have discharge and itching.   She declines Annual today and pap. Would like to reschedule for this. Was not aware that a pap was recommended today.    Gynecologic History Patient's last menstrual period was 08/11/2020. Last Pap: NA- declined today.    Obstetric History OB History  Gravida Para Term Preterm AB Living  0 0 0 0 0 0  SAB IAB Ectopic Multiple Live Births  0 0 0 0 0    Past Medical History:  Diagnosis Date  . ADHD (attention deficit hyperactivity disorder)   . Asthma   . Back pain   . Dysmenorrhea in the adolescent     History reviewed. No pertinent surgical history.  Current Outpatient Medications on File Prior to Visit  Medication Sig Dispense Refill  . ibuprofen (ADVIL) 600 MG tablet Take 1 tablet (600 mg total) by mouth every 6 (six) hours as needed. 30 tablet 0  . [DISCONTINUED] fluticasone (FLONASE) 50 MCG/ACT nasal spray Place 2 sprays into both nostrils daily. 16 g 6  . [DISCONTINUED] gabapentin (NEURONTIN) 100 MG capsule Take 300 mg by mouth 3 (three) times daily.    . [DISCONTINUED] loratadine (CLARITIN) 10 MG tablet Take 1 tablet (10 mg total) by mouth daily. 30 tablet 11  . [DISCONTINUED] medroxyPROGESTERone (DEPO-PROVERA) 150 MG/ML injection INJECT 1 ML INTO THE MUSCLE EVERY 3 MONTHS 1 mL 1   No current facility-administered medications on file prior to visit.    Allergies  Allergen Reactions  . Codeine Itching and Rash  . Sulfa Antibiotics Itching and Rash  . Sulfasalazine Itching and Rash    Social History:   reports that she has never smoked. She has never used smokeless tobacco. She reports that she does not drink alcohol and does not use drugs.  Family History  Problem Relation Age of Onset  . Hypertension Mother   . Healthy Brother     The following portions of the patient's history were reviewed and updated as appropriate: allergies, current medications, past family history, past medical history, past social history, past surgical history and problem list.  Review of Systems Pertinent items noted in HPI and remainder of comprehensive ROS otherwise negative.  Physical Exam:  BP 124/74   Pulse 77   Resp 16   Ht 5\' 6"  (1.676 m)   Wt 154 lb (69.9 kg)   LMP 08/11/2020   BMI 24.86 kg/m  CONSTITUTIONAL: Well-developed, well-nourished female in no acute distress.  HENT:  Normocephalic NEUROLOGIC: Alert and oriented to person, place, and time.  PSYCHIATRIC: Normal mood and affect. Anxious behavior.  PELVIC: Normal appearing external genitalia and urethral meatus; vaginal swabs collected without speculum.    Assessment and Plan:   1. Vaginal discharge  - patient refused annual/ pap today. She will reschedule.  - Cervicovaginal ancillary only( Progress) - HIV antibody (with reflex) - Hepatitis C Antibody - HgB A1c   Lauryn Lizardi, Artist Pais, NP [place] for [employer], [place]

## 2020-08-16 LAB — CERVICOVAGINAL ANCILLARY ONLY
Candida Glabrata: NEGATIVE
Candida Vaginitis: NEGATIVE
Comment: NEGATIVE
Comment: NEGATIVE
Comment: NEGATIVE
Comment: NORMAL
Neisseria Gonorrhea: NEGATIVE
Trichomonas: NEGATIVE

## 2020-08-30 ENCOUNTER — Other Ambulatory Visit (HOSPITAL_COMMUNITY)
Admission: RE | Admit: 2020-08-30 | Discharge: 2020-08-30 | Disposition: A | Discharge status: Home / Self Care | Payer: BLUE CROSS/BLUE SHIELD | Source: Ambulatory Visit | Attending: Obstetrics and Gynecology | Admitting: Obstetrics and Gynecology

## 2020-08-30 ENCOUNTER — Other Ambulatory Visit: Payer: Self-pay

## 2020-08-30 ENCOUNTER — Encounter: Payer: Self-pay | Admitting: Obstetrics and Gynecology

## 2020-08-30 ENCOUNTER — Ambulatory Visit (INDEPENDENT_AMBULATORY_CARE_PROVIDER_SITE_OTHER): Payer: BLUE CROSS/BLUE SHIELD | Admitting: Obstetrics and Gynecology

## 2020-08-30 VITALS — BP 109/72 | HR 76 | Resp 16 | Ht 66.0 in | Wt 156.0 lb

## 2020-08-30 DIAGNOSIS — N898 Other specified noninflammatory disorders of vagina: Secondary | ICD-10-CM

## 2020-08-30 NOTE — Progress Notes (Signed)
   GYNECOLOGY OFFICE VISIT NOTE  History:  25 y.o. G0P0000 here today for vaginal discharge and itching. Has recurrent yeast infedctions, not sure if this is one. She denies any abnormal bleeding, pelvic pain or other concerns.   Past Medical History:  Diagnosis Date   ADHD (attention deficit hyperactivity disorder)    Asthma    Back pain    Dysmenorrhea in the adolescent     History reviewed. No pertinent surgical history.   Current Outpatient Medications:    ibuprofen (ADVIL) 600 MG tablet, Take 1 tablet (600 mg total) by mouth every 6 (six) hours as needed., Disp: 30 tablet, Rfl: 0  The following portions of the patient's history were reviewed and updated as appropriate: allergies, current medications, past family history, past medical history, past social history, past surgical history and problem list.   Review of Systems:  Pertinent items noted in HPI and remainder of comprehensive ROS otherwise negative.   Objective:  Physical Exam BP 109/72   Pulse 76   Resp 16   Ht 5\' 6"  (1.676 m)   Wt 156 lb (70.8 kg)   LMP 08/11/2020   BMI 25.18 kg/m  CONSTITUTIONAL: Well-developed, well-nourished female in no acute distress.  HENT:  Normocephalic, atraumatic. External right and left ear normal. Oropharynx is clear and moist EYES: Conjunctivae and EOM are normal. Pupils are equal, round, and reactive to light. No scleral icterus.  NECK: Normal range of motion, supple, no masses SKIN: Skin is warm and dry. No rash noted. Not diaphoretic. No erythema. No pallor. NEUROLOGIC: Alert and oriented to person, place, and time. Normal reflexes, muscle tone coordination. No cranial nerve deficit noted. PSYCHIATRIC: Normal mood and affect. Normal behavior. Normal judgment and thought content. CARDIOVASCULAR: Normal heart rate noted RESPIRATORY: Effort normal, no problems with respiration noted ABDOMEN: Soft, no distention noted.   PELVIC: Normal appearing external genitalia with some white  discharge, normal appearing vaginal mucosa with white discharge.  pelvic cultures obtained.  MUSCULOSKELETAL: Normal range of motion. No edema noted.  Exam done with chaperone present.  Labs and Imaging No results found.  Assessment & Plan:  1. Vaginal discharge Possible yeast, check wet prep - Cervicovaginal ancillary only( [place])  2. Vaginal itching   Routine preventative health maintenance measures emphasized. Please refer to After Visit Summary for other counseling recommendations.   No follow-ups on file.    Feliz Beam, MD, [place] for [facility])

## 2020-08-31 ENCOUNTER — Other Ambulatory Visit: Payer: Self-pay | Admitting: Family Medicine

## 2020-08-31 DIAGNOSIS — N898 Other specified noninflammatory disorders of vagina: Secondary | ICD-10-CM

## 2020-08-31 LAB — CERVICOVAGINAL ANCILLARY ONLY
Bacterial Vaginitis (gardnerella): POSITIVE — AB
Candida Glabrata: NEGATIVE
Candida Vaginitis: POSITIVE — AB
Comment: NEGATIVE
Comment: NEGATIVE
Comment: NEGATIVE

## 2020-08-31 MED ORDER — METRONIDAZOLE 500 MG PO TABS: 500.0000 mg | ORAL_TABLET | Freq: Two times a day (BID) | ORAL | 0 refills | Status: AC

## 2020-08-31 MED ORDER — FLUCONAZOLE 150 MG PO TABS: 150.0000 mg | ORAL_TABLET | Freq: Once | ORAL | 2 refills | Status: AC

## 2020-09-04 ENCOUNTER — Ambulatory Visit: Payer: BLUE CROSS/BLUE SHIELD | Admitting: Obstetrics and Gynecology

## 2020-09-12 ENCOUNTER — Ambulatory Visit: Payer: BLUE CROSS/BLUE SHIELD | Admitting: Obstetrics and Gynecology

## 2020-09-19 ENCOUNTER — Ambulatory Visit: Payer: BLUE CROSS/BLUE SHIELD | Admitting: Certified Nurse Midwife

## 2020-09-19 ENCOUNTER — Telehealth: Payer: Self-pay | Admitting: *Deleted

## 2020-09-19 NOTE — Telephone Encounter (Signed)
No voicemail set up to leave a message to check if patient is coming to her appointment today.

## 2020-10-04 ENCOUNTER — Emergency Department (HOSPITAL_BASED_OUTPATIENT_CLINIC_OR_DEPARTMENT_OTHER)
Admission: EM | Admit: 2020-10-04 | Discharge: 2020-10-05 | Disposition: A | Discharge status: Home / Self Care | Payer: BLUE CROSS/BLUE SHIELD | Source: Home / Self Care | Attending: Emergency Medicine | Admitting: Emergency Medicine

## 2020-10-04 ENCOUNTER — Emergency Department (HOSPITAL_BASED_OUTPATIENT_CLINIC_OR_DEPARTMENT_OTHER): Payer: BLUE CROSS/BLUE SHIELD

## 2020-10-04 ENCOUNTER — Other Ambulatory Visit: Payer: Self-pay

## 2020-10-04 ENCOUNTER — Emergency Department (HOSPITAL_BASED_OUTPATIENT_CLINIC_OR_DEPARTMENT_OTHER)
Admission: EM | Admit: 2020-10-04 | Discharge: 2020-10-04 | Disposition: A | Discharge status: Home / Self Care | Payer: BLUE CROSS/BLUE SHIELD | Attending: Emergency Medicine | Admitting: Emergency Medicine

## 2020-10-04 ENCOUNTER — Encounter (HOSPITAL_BASED_OUTPATIENT_CLINIC_OR_DEPARTMENT_OTHER): Payer: Self-pay | Admitting: *Deleted

## 2020-10-04 DIAGNOSIS — A6004 Herpesviral vulvovaginitis: Secondary | ICD-10-CM

## 2020-10-04 DIAGNOSIS — R509 Fever, unspecified: Secondary | ICD-10-CM

## 2020-10-04 DIAGNOSIS — E876 Hypokalemia: Secondary | ICD-10-CM | POA: Insufficient documentation

## 2020-10-04 DIAGNOSIS — B3731 Acute candidiasis of vulva and vagina: Secondary | ICD-10-CM

## 2020-10-04 DIAGNOSIS — N760 Acute vaginitis: Secondary | ICD-10-CM | POA: Insufficient documentation

## 2020-10-04 DIAGNOSIS — Z28310 Unvaccinated for covid-19: Secondary | ICD-10-CM | POA: Diagnosis not present

## 2020-10-04 DIAGNOSIS — B379 Candidiasis, unspecified: Secondary | ICD-10-CM | POA: Diagnosis not present

## 2020-10-04 DIAGNOSIS — N898 Other specified noninflammatory disorders of vagina: Secondary | ICD-10-CM | POA: Diagnosis present

## 2020-10-04 DIAGNOSIS — Z20822 Contact with and (suspected) exposure to covid-19: Secondary | ICD-10-CM | POA: Insufficient documentation

## 2020-10-04 DIAGNOSIS — J45909 Unspecified asthma, uncomplicated: Secondary | ICD-10-CM | POA: Insufficient documentation

## 2020-10-04 DIAGNOSIS — B373 Candidiasis of vulva and vagina: Secondary | ICD-10-CM

## 2020-10-04 LAB — URINALYSIS, ROUTINE W REFLEX MICROSCOPIC
Bilirubin Urine: NEGATIVE
Glucose, UA: NEGATIVE mg/dL
Hgb urine dipstick: NEGATIVE
Ketones, ur: 40 mg/dL — AB
Nitrite: NEGATIVE
Protein, ur: NEGATIVE mg/dL
Specific Gravity, Urine: 1.025 (ref 1.005–1.030)
pH: 6 (ref 5.0–8.0)

## 2020-10-04 LAB — BASIC METABOLIC PANEL
Anion gap: 9 (ref 5–15)
BUN: 12 mg/dL (ref 6–20)
CO2: 23 mmol/L (ref 22–32)
Calcium: 9.1 mg/dL (ref 8.9–10.3)
Chloride: 101 mmol/L (ref 98–111)
Creatinine, Ser: 0.77 mg/dL (ref 0.44–1.00)
GFR, Estimated: >60 mL/min (ref >60)
Glucose, Bld: 94 mg/dL (ref 70–99)
Potassium: 3.4 mmol/L — ABNORMAL LOW (ref 3.5–5.1)
Sodium: 133 mmol/L — ABNORMAL LOW (ref 135–145)

## 2020-10-04 LAB — RESP PANEL BY RT-PCR (FLU A&B, COVID) ARPGX2
Influenza A by PCR: NEGATIVE
Influenza B by PCR: NEGATIVE
SARS Coronavirus 2 by RT PCR: NEGATIVE

## 2020-10-04 LAB — CBC WITH DIFFERENTIAL/PLATELET
Abs Immature Granulocytes: 0.05 10*3/uL (ref 0.00–0.07)
Basophils Absolute: 0 10*3/uL (ref 0.0–0.1)
Basophils Relative: 0 %
Eosinophils Absolute: 0 10*3/uL (ref 0.0–0.5)
Eosinophils Relative: 0 %
HCT: 38.4 % (ref 36.0–46.0)
Hemoglobin: 12.4 g/dL (ref 12.0–15.0)
Immature Granulocytes: 0 %
Lymphocytes Relative: 3 %
Lymphs Abs: 0.4 10*3/uL — ABNORMAL LOW (ref 0.7–4.0)
MCH: 26.2 pg (ref 26.0–34.0)
MCHC: 32.3 g/dL (ref 30.0–36.0)
MCV: 81.2 fL (ref 80.0–100.0)
Monocytes Absolute: 0.7 10*3/uL (ref 0.1–1.0)
Monocytes Relative: 4 %
Neutro Abs: 14 10*3/uL — ABNORMAL HIGH (ref 1.7–7.7)
Neutrophils Relative %: 93 %
Platelets: 229 10*3/uL (ref 150–400)
RBC: 4.73 MIL/uL (ref 3.87–5.11)
RDW: 12.7 % (ref 11.5–15.5)
WBC: 15.2 10*3/uL — ABNORMAL HIGH (ref 4.0–10.5)
nRBC: 0 % (ref 0.0–0.2)

## 2020-10-04 LAB — RPR: RPR Ser Ql: NONREACTIVE

## 2020-10-04 LAB — URINALYSIS, MICROSCOPIC (REFLEX)

## 2020-10-04 LAB — HIV ANTIBODY (ROUTINE TESTING W REFLEX): HIV Screen 4th Generation wRfx: NONREACTIVE

## 2020-10-04 MED ORDER — VALACYCLOVIR HCL 500 MG PO TABS: ORAL_TABLET | ORAL | 1 refills | Status: DC

## 2020-10-04 MED ORDER — POTASSIUM CHLORIDE CRYS ER 20 MEQ PO TBCR
40.0000 meq | EXTENDED_RELEASE_TABLET | Freq: Once | ORAL | Status: AC
  Administered 2020-10-04: 40 meq via ORAL
  Filled 2020-10-04: qty 2

## 2020-10-04 MED ORDER — FLUCONAZOLE 150 MG PO TABS: 150.0000 mg | ORAL_TABLET | Freq: Every day | ORAL | 0 refills | Status: DC

## 2020-10-04 MED ORDER — VALACYCLOVIR HCL 1 G PO TABS: 1000.0000 mg | ORAL_TABLET | Freq: Two times a day (BID) | ORAL | 0 refills | Status: DC

## 2020-10-04 MED ORDER — VALACYCLOVIR HCL 500 MG PO TABS
1000.0000 mg | ORAL_TABLET | Freq: Once | ORAL | Status: AC
  Administered 2020-10-05: 1000 mg via ORAL
  Filled 2020-10-04: qty 2

## 2020-10-04 NOTE — ED Provider Notes (Signed)
[place] DE[facility] Provider Note: Georgena Spurling, MD, FACEP  CSN: [id number] MRN: [id number] ARRIVAL: 10/04/20 at [place]: [place] DISE[facility]   HISTORY OF PRESENT ILLNESS  10/04/20 11:20 PM Teresa Daniel is a 25 y.o. female who was seen in a [place] ED on 10/02/2020 with the complaint that she had had a yeast infection since December.  She told the staff that it usually takes "500 mg" of Diflucan to get rid of her yeast infections.  She had been prescribed 150 mg and told him it was not helping.  Wet prep was positive only for clue cells.  She was discharged on topical clotrimazole vaginal cream and oral Flagyl for 7 days.  She was seen here this morning for fever.  A work-up did not reveal the source of the fever.  She also expressed concern about her yeast infection not having improved with fluconazole.  The EDP researched high-dose fluconazole I could find no literature supporting it.  She was given an additional dose of fluconazole 150 mg.  No pelvic exam was documented, and no wet prep or GC/chlamydia was performed.  [facility] tested her for HIV and RPR which were both negative.  GC/chlamydia results from [facility] were not available.  She states she has had painful vulvovaginal sores for about a week.  She does not have burning with urination but urination irritates the sores and causes moderate to severe discomfort.  She has no history of similar sores in the past.  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   Past Medical History:  Diagnosis Date   ADHD (attention deficit hyperactivity disorder)    Asthma    Back pain    Dysmenorrhea in the adolescent     No past surgical history on file.  Family History  Problem Relation Age of Onset   Hypertension Mother    Healthy Brother     Social History   Tobacco Use   Smoking status: Never   Smokeless tobacco: Never  Vaping Use   Vaping Use: Never  used  Substance Use Topics   Alcohol use: No   Drug use: No    Prior to Admission medications   Medication Sig Start Date End Date Taking? Authorizing Provider  valACYclovir (VALTREX) 1000 MG tablet Take 1 tablet (1,000 mg total) by mouth 2 (two) times daily. 10/04/20  Yes Markcus Lazenby, MD  valACYclovir (VALTREX) 500 MG tablet Take 1 tablet twice daily for 3 days in case of herpes recurrence.  Start taking as soon as symptoms occur. 10/04/20  Yes Donnia Poplaski, MD  fluconazole (DIFLUCAN) 150 MG tablet Take 1 tablet (150 mg total) by mouth daily. 7/49/44   Delora Fuel, MD  ibuprofen (ADVIL) 600 MG tablet Take 1 tablet (600 mg total) by mouth every 6 (six) hours as needed. 07/25/20   Faustino Congress, NP  fluticasone (FLONASE) 50 MCG/ACT nasal spray Place 2 sprays into both nostrils daily. 06/15/15 03/20/20  Evern Core, MD  gabapentin (NEURONTIN) 100 MG capsule Take 300 mg by mouth 3 (three) times daily.  09/30/19  [provider]  loratadine (CLARITIN) 10 MG tablet Take 1 tablet (10 mg total) by mouth daily. 06/15/15 03/20/20  Evern Core, MD  medroxyPROGESTERone (DEPO-PROVERA) 150 MG/ML injection INJECT 1 ML INTO THE MUSCLE EVERY 3 MONTHS 08/20/16 09/30/19  McDonell, Kyra Manges, MD    Allergies Codeine, Sulfa antibiotics, and Sulfasalazine   REVIEW OF SYSTEMS  Negative except  as noted here or in the History of Present Illness.   PHYSICAL EXAMINATION  Initial Vital Signs Blood pressure 123/70, pulse 88, temperature 98.4 F (36.9 C), temperature source Oral, resp. rate 18, height 5\' 6"  (1.676 m), weight 69.4 kg, last menstrual period 09/06/2020, SpO2 100 %.  Examination General: Well-developed, well-nourished female in no acute distress; appearance consistent with age of record HENT: normocephalic; atraumatic Eyes: Normal appearance Neck: supple Heart: regular rate and rhythm Lungs: clear to auscultation bilaterally Abdomen: soft; nondistended;  nontender; bowel sounds present GU: Topical cream on vulvovaginal region; vesicular vulvovaginal rash, tender to palpation; speculum exam not done due to tenderness Extremities: No deformity; full range of motion; pulses normal Neurologic: Awake, alert and oriented; motor function intact in all extremities and symmetric; no facial droop Skin: Warm and dry Psychiatric: Normal mood and affect   RESULTS  Summary of this visit's results, reviewed and interpreted by myself:   EKG Interpretation  Date/Time:    Ventricular Rate:    PR Interval:    QRS Duration:   QT Interval:    QTC Calculation:   R Axis:     Text Interpretation:         Laboratory Studies: Results for orders placed or performed during the hospital encounter of 10/04/20 (from the past 24 hour(s))  Resp Panel by RT-PCR (Flu A&B, Covid) Nasopharyngeal Swab     Status: None   Collection Time: 10/04/20  3:24 AM   Specimen: Nasopharyngeal Swab; Nasopharyngeal(NP) swabs in vial transport medium  Result Value Ref Range   SARS Coronavirus 2 by RT PCR NEGATIVE NEGATIVE   Influenza A by PCR NEGATIVE NEGATIVE   Influenza B by PCR NEGATIVE NEGATIVE  Basic metabolic panel     Status: Abnormal   Collection Time: 10/04/20  4:00 AM  Result Value Ref Range   Sodium 133 (L) 135 - 145 mmol/L   Potassium 3.4 (L) 3.5 - 5.1 mmol/L   Chloride 101 98 - 111 mmol/L   CO2 23 22 - 32 mmol/L   Glucose, Bld 94 70 - 99 mg/dL   BUN 12 6 - 20 mg/dL   Creatinine, Ser 0.77 0.44 - 1.00 mg/dL   Calcium 9.1 8.9 - 10.3 mg/dL   GFR, Estimated >60 >60 mL/min   Anion gap 9 5 - 15  CBC with Differential     Status: Abnormal   Collection Time: 10/04/20  4:00 AM  Result Value Ref Range   WBC 15.2 (H) 4.0 - 10.5 K/uL   RBC 4.73 3.87 - 5.11 MIL/uL   Hemoglobin 12.4 12.0 - 15.0 g/dL   HCT 38.4 36.0 - 46.0 %   MCV 81.2 80.0 - 100.0 fL   MCH 26.2 26.0 - 34.0 pg   MCHC 32.3 30.0 - 36.0 g/dL   RDW 12.7 11.5 - 15.5 %   Platelets 229 150 - 400 K/uL    nRBC 0.0 0.0 - 0.2 %   Neutrophils Relative % 93 %   Neutro Abs 14.0 (H) 1.7 - 7.7 K/uL   Lymphocytes Relative 3 %   Lymphs Abs 0.4 (L) 0.7 - 4.0 K/uL   Monocytes Relative 4 %   Monocytes Absolute 0.7 0.1 - 1.0 K/uL   Eosinophils Relative 0 %   Eosinophils Absolute 0.0 0.0 - 0.5 K/uL   Basophils Relative 0 %   Basophils Absolute 0.0 0.0 - 0.1 K/uL   Immature Granulocytes 0 %   Abs Immature Granulocytes 0.05 0.00 - 0.07 K/uL  RPR  Status: None   Collection Time: 10/04/20  4:00 AM  Result Value Ref Range   RPR Ser Ql NON REACTIVE NON REACTIVE  HIV Antibody (routine testing w rflx)     Status: None   Collection Time: 10/04/20  4:00 AM  Result Value Ref Range   HIV Screen 4th Generation wRfx Non Reactive Non Reactive  Urinalysis, Routine w reflex microscopic Urine, Clean Catch     Status: Abnormal   Collection Time: 10/04/20  4:21 AM  Result Value Ref Range   Color, Urine YELLOW YELLOW   APPearance CLOUDY (A) CLEAR   Specific Gravity, Urine 1.025 1.005 - 1.030   pH 6.0 5.0 - 8.0   Glucose, UA NEGATIVE NEGATIVE mg/dL   Hgb urine dipstick NEGATIVE NEGATIVE   Bilirubin Urine NEGATIVE NEGATIVE   Ketones, ur 40 (A) NEGATIVE mg/dL   Protein, ur NEGATIVE NEGATIVE mg/dL   Nitrite NEGATIVE NEGATIVE   Leukocytes,Ua TRACE (A) NEGATIVE  Urinalysis, Microscopic (reflex)     Status: Abnormal   Collection Time: 10/04/20  4:21 AM  Result Value Ref Range   RBC / HPF 0-5 0 - 5 RBC/hpf   WBC, UA 0-5 0 - 5 WBC/hpf   Bacteria, UA FEW (A) NONE SEEN   Squamous Epithelial / LPF 6-10 0 - 5   Imaging Studies: DG Chest 2 View  Result Date: 10/04/2020 CLINICAL DATA:  Fever, history of asthma EXAM: CHEST - 2 VIEW COMPARISON:  None. FINDINGS: Mild central airways thickening. No consolidation, features of edema, pneumothorax, or effusion. Pulmonary vascularity is normally distributed. The cardiomediastinal contours are unremarkable. No acute osseous or soft tissue abnormality. IMPRESSION: Mild airways  thickening, may reflect an acute viral infection/bronchitis in the setting of fever or reactive airways disease. Electronically Signed   By: Lovena Le M.D.   On: 10/04/2020 03:35    ED COURSE and MDM  Nursing notes, initial and subsequent vitals signs, including pulse oximetry, reviewed and interpreted by myself.  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  She is outside the window for Valtrex treatment.  Two vesicles were broken with a viral swab and sent for herpes culture.  This may be the cause of her fever earlier.  We will give her prescription for Valtrex although she is likely outside the window for treatment.    She was advised that HPV infection causes cervical dysplasia, cervical cancer and genital warts.  No evidence of genital warts were seen on exam.  PROCEDURES  Procedures   ED DIAGNOSES     ICD-10-CM   1. Herpes simplex vulvovaginitis  A60.04          Zaylan Kissoon, MD 10/04/20 2345

## 2020-10-04 NOTE — Discharge Instructions (Addendum)
Drink plenty of fluids.  Take ibuprofen and/or acetaminophen as needed for fever.

## 2020-10-04 NOTE — ED Triage Notes (Signed)
Vaginal discharge x 1 week. Dx with yeast infection and BV yesterday.

## 2020-10-04 NOTE — ED Triage Notes (Signed)
C/o vaginal sores and DX HPV today

## 2020-10-04 NOTE — ED Provider Notes (Signed)
[place] [facility] EMERGENCY DEPARTMENT Provider Note   CSN: [id number] Arrival date & time: 10/04/20  0257     History Chief Complaint  Patient presents with   Vaginal Discharge    Teresa Daniel is a 25 y.o. female.  The history is provided by the patient.  Vaginal Discharge She has history of asthma, attention deficit disorder.  Her chief complaint is actually fever.  She had been seen at another hospital emergency department yesterday for vaginal discharge and was diagnosed with monilia and bacterial vaginosis and given prescriptions for metronidazole and clotrimazole cream.  Today, she started running fever of 102.4 with associated chills.  She denies any sweats.  She denies rhinorrhea, sore throat, cough, nausea, vomiting, diarrhea.  She has had some dysuria.  She was exposed to someone who had COVID-19.  She states that she had an infection with COVID-19 3 months ago.  She has not been vaccinated against COVID-19.  She is also concerned because her vaginal yeast infection has not responded to fluconazole 150 mg.  She states that in the past, she has required fluconazole 500mg .   Past Medical History:  Diagnosis Date   ADHD (attention deficit hyperactivity disorder)    Asthma    Back pain    Dysmenorrhea in the adolescent     Patient Active Problem List   Diagnosis Date Noted   Thoracic back pain 08/01/2016   Blurred vision 07/31/2016   Numbness and tingling 07/31/2016   Menstrual migraine 08/10/2014   Dysmenorrhea 01/03/2014   ADHD (attention deficit hyperactivity disorder) 06/09/2012   Asthma, chronic 06/09/2012    History reviewed. No pertinent surgical history.   OB History     Gravida  0   Para  0   Term  0   Preterm  0   AB  0   Living  0      SAB  0   IAB  0   Ectopic  0   Multiple  0   Live Births  0           Family History  Problem Relation Age of Onset   Hypertension Mother    Healthy Brother     Social History    Tobacco Use   Smoking status: Never   Smokeless tobacco: Never  Vaping Use   Vaping Use: Never used  Substance Use Topics   Alcohol use: No   Drug use: No    Home Medications Prior to Admission medications   Medication Sig Start Date End Date Taking? Authorizing Provider  ibuprofen (ADVIL) 600 MG tablet Take 1 tablet (600 mg total) by mouth every 6 (six) hours as needed. 07/25/20   Faustino Congress, NP  fluticasone (FLONASE) 50 MCG/ACT nasal spray Place 2 sprays into both nostrils daily. 06/15/15 03/20/20  Evern Core, MD  gabapentin (NEURONTIN) 100 MG capsule Take 300 mg by mouth 3 (three) times daily.  09/30/19  [provider]  loratadine (CLARITIN) 10 MG tablet Take 1 tablet (10 mg total) by mouth daily. 06/15/15 03/20/20  Evern Core, MD  medroxyPROGESTERone (DEPO-PROVERA) 150 MG/ML injection INJECT 1 ML INTO THE MUSCLE EVERY 3 MONTHS 08/20/16 09/30/19  McDonell, Kyra Manges, MD    Allergies    Codeine, Sulfa antibiotics, and Sulfasalazine  Review of Systems   Review of Systems  Genitourinary:  Positive for vaginal discharge.  All other systems reviewed and are negative.  Physical Exam Updated Vital Signs BP (!) 132/94 (BP Location: Right Arm)  Pulse (!) 113   Temp (!) 100.4 F (38 C) (Oral)   Resp 18   Ht 5\' 6"  (1.676 m)   Wt 69.4 kg   LMP 09/06/2020   SpO2 99%   BMI 24.69 kg/m   Physical Exam Vitals and nursing note reviewed.  25 year old female, resting comfortably and in no acute distress. Vital signs are significant for elevated temperature and heart rate, borderline elevated blood pressure. Oxygen saturation is 99%, which is normal. Head is normocephalic and atraumatic. PERRLA, EOMI. Oropharynx is clear. Neck is nontender and supple without adenopathy or JVD. Back is nontender and there is no CVA tenderness. Lungs are clear without rales, wheezes, or rhonchi. Chest is nontender. Heart has regular rate and rhythm without  murmur. Abdomen is soft, flat, nontender without masses or hepatosplenomegaly and peristalsis is normoactive. Extremities have no cyanosis or edema, full range of motion is present. Skin is warm and dry without rash. Neurologic: Mental status is normal, cranial nerves are intact, there are no motor or sensory deficits.  ED Results / Procedures / Treatments   Labs (all labs ordered are listed, but only abnormal results are displayed) Labs Reviewed  URINALYSIS, ROUTINE W REFLEX MICROSCOPIC - Abnormal; Notable for the following components:      Result Value   APPearance CLOUDY (*)    Ketones, ur 40 (*)    Leukocytes,Ua TRACE (*)    All other components within normal limits  BASIC METABOLIC PANEL - Abnormal; Notable for the following components:   Sodium 133 (*)    Potassium 3.4 (*)    All other components within normal limits  CBC WITH DIFFERENTIAL/PLATELET - Abnormal; Notable for the following components:   WBC 15.2 (*)    Neutro Abs 14.0 (*)    Lymphs Abs 0.4 (*)    All other components within normal limits  URINALYSIS, MICROSCOPIC (REFLEX) - Abnormal; Notable for the following components:   Bacteria, UA FEW (*)    All other components within normal limits  RESP PANEL BY RT-PCR (FLU A&B, COVID) ARPGX2  RPR  HIV ANTIBODY (ROUTINE TESTING W REFLEX)    Radiology DG Chest 2 View  Result Date: 10/04/2020 CLINICAL DATA:  Fever, history of asthma EXAM: CHEST - 2 VIEW COMPARISON:  None. FINDINGS: Mild central airways thickening. No consolidation, features of edema, pneumothorax, or effusion. Pulmonary vascularity is normally distributed. The cardiomediastinal contours are unremarkable. No acute osseous or soft tissue abnormality. IMPRESSION: Mild airways thickening, may reflect an acute viral infection/bronchitis in the setting of fever or reactive airways disease. Electronically Signed   By: Lovena Le M.D.   On: 10/04/2020 03:35    Procedures Procedures   Medications Ordered in  ED Medications  potassium chloride SA (KLOR-CON) CR tablet 40 mEq (has no administration in time range)    ED Course  I have reviewed the triage vital signs and the nursing notes.  Pertinent labs & imaging results that were available during my care of the patient were reviewed by me and considered in my medical decision making (see chart for details).   MDM Rules/Calculators/A&P                         Fever with no obvious source.  Will check chest x-ray, urinalysis, screening labs as well as respiratory pathogen swab.  Bacterial vaginosis currently being appropriately treated.  Resistant vaginal yeast infection.  I have reviewed medical literature and can find no support for high  dose fluconazole to treat vaginal yeast infection, but resistant infections can be treated with fluconazole 150 mg a day for 2 weeks.  She will be given a prescription for that.  Old records are reviewed confirming ED visit yesterday at which time specimen was sent for [facility] and chlamydia testing.  As there was no testing for syphilis or HIV and I have recommended those be done today.  Patient is in agreement.  Chest x-ray shows no evidence of pneumonia.  Labs show mild hypokalemia and she is given a dose of oral potassium.  Respiratory pathogen swab is negative for influenza and COVID-19.  Patient is advised to use over-the-counter antipyretics as needed, return if symptoms worsen.  Final Clinical Impression(s) / ED Diagnoses Final diagnoses:  Fever in adult  Hypokalemia  Yeast vaginitis    Rx / DC Orders ED Discharge Orders     None        Delora Fuel, MD 65/53/74 [id number]

## 2020-10-05 ENCOUNTER — Ambulatory Visit: Payer: BLUE CROSS/BLUE SHIELD | Admitting: Certified Nurse Midwife

## 2020-10-09 LAB — HSV CULTURE AND TYPING

## 2020-10-25 ENCOUNTER — Emergency Department (INDEPENDENT_AMBULATORY_CARE_PROVIDER_SITE_OTHER)
Admission: EM | Admit: 2020-10-25 | Discharge: 2020-10-25 | Disposition: A | Discharge status: Home / Self Care | Payer: BLUE CROSS/BLUE SHIELD | Source: Home / Self Care

## 2020-10-25 ENCOUNTER — Other Ambulatory Visit: Payer: Self-pay

## 2020-10-25 DIAGNOSIS — K5900 Constipation, unspecified: Secondary | ICD-10-CM | POA: Diagnosis not present

## 2020-10-25 MED ORDER — MAGNESIUM CITRATE PO SOLN: ORAL | 0 refills | Status: DC

## 2020-10-25 NOTE — Discharge Instructions (Addendum)
Advised/instructed patient to take medication as directed to completion.  Advised/encouraged patient to increase daily water intake to 32 to 48 ounces per day 7 days/week.  Advised increase daily fiber intake to 40-50 grams per days.

## 2020-10-25 NOTE — ED Provider Notes (Signed)
[name]    CSN: [id number] Arrival date & time: 10/25/20  1746      History   Chief Complaint Chief Complaint  Patient presents with   Constipation    HPI Teresa Daniel is a 25 y.o. female.   HPI 25 year old female presents with intermittent constipation for 2 weeks.  Patient reports taking laxative last week to help some; however, constipation is back.  Patient denies abdominal pain, endorses noticing blood on toilet paper when wiping.  Past Medical History:  Diagnosis Date   ADHD (attention deficit hyperactivity disorder)    Asthma    Back pain    Dysmenorrhea in the adolescent     Patient Active Problem List   Diagnosis Date Noted   Thoracic back pain 08/01/2016   Blurred vision 07/31/2016   Numbness and tingling 07/31/2016   Menstrual migraine 08/10/2014   Dysmenorrhea 01/03/2014   ADHD (attention deficit hyperactivity disorder) 06/09/2012   Asthma, chronic 06/09/2012    History reviewed. No pertinent surgical history.  OB History     Gravida  0   Para  0   Term  0   Preterm  0   AB  0   Living  0      SAB  0   IAB  0   Ectopic  0   Multiple  0   Live Births  0            Home Medications    Prior to Admission medications   Medication Sig Start Date End Date Taking? Authorizing Provider  magnesium citrate SOLN Take 150 mL PO twice daily for three days 10/25/20  Yes Eliezer Lofts, FNP  fluconazole (DIFLUCAN) 150 MG tablet Take 1 tablet (150 mg total) by mouth daily. [id number]   Delora Fuel, MD  ibuprofen (ADVIL) 600 MG tablet Take 1 tablet (600 mg total) by mouth every 6 (six) hours as needed. 07/25/20   Faustino Congress, NP  valACYclovir (VALTREX) 1000 MG tablet Take 1 tablet (1,000 mg total) by mouth 2 (two) times daily. 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  Start taking as soon as symptoms occur. 10/04/20   Molpus,  John, MD  fluticasone (FLONASE) 50 MCG/ACT nasal spray Place 2 sprays into both nostrils daily. 06/15/15 03/20/20  Evern Core, MD  gabapentin (NEURONTIN) 100 MG capsule Take 300 mg by mouth 3 (three) times daily.  09/30/19  [provider]  loratadine (CLARITIN) 10 MG tablet Take 1 tablet (10 mg total) by mouth daily. 06/15/15 03/20/20  Evern Core, MD  medroxyPROGESTERone (DEPO-PROVERA) 150 MG/ML injection INJECT 1 ML INTO THE MUSCLE EVERY 3 MONTHS 08/20/16 09/30/19  McDonell, Kyra Manges, MD    Family History Family History  Problem Relation Age of Onset   Hypertension Mother    Healthy Brother     Social History Social History   Tobacco Use   Smoking status: Never   Smokeless tobacco: Never  Vaping Use   Vaping Use: Never used  Substance Use Topics   Alcohol use: No   Drug use: No     Allergies   Codeine, Sulfa antibiotics, and Sulfasalazine   Review of Systems Review of Systems  Gastrointestinal:  Positive for constipation.  All other systems reviewed and are negative.   Physical Exam Triage Vital Signs ED Triage Vitals [10/25/20 1815]  Enc Vitals Group  BP 124/82     Pulse Rate 76     Resp 18     Temp 98.5 F (36.9 C)     Temp Source Oral     SpO2 98 %     Weight      Height      Head Circumference      Peak Flow      Pain Score 0     Pain Loc      Pain Edu?      Excl. in [place]?    No data found.  Updated Vital Signs BP 124/82 (BP Location: Right Arm)   Pulse 76   Temp 98.5 F (36.9 C) (Oral)   Resp 18   LMP 10/04/2020   SpO2 98%    Physical Exam Vitals and nursing note reviewed.  Constitutional:      General: She is not in acute distress.    Appearance: Normal appearance. She is normal weight. She is not ill-appearing.  HENT:     Head: Normocephalic and atraumatic.     Mouth/Throat:     Mouth: Mucous membranes are moist.     Pharynx: Oropharynx is clear.  Eyes:     Extraocular Movements: Extraocular  movements intact.     Conjunctiva/sclera: Conjunctivae normal.     Pupils: Pupils are equal, round, and reactive to light.  Cardiovascular:     Rate and Rhythm: Normal rate and regular rhythm.     Pulses: Normal pulses.     Heart sounds: Normal heart sounds.  Pulmonary:     Effort: Pulmonary effort is normal. No respiratory distress.     Breath sounds: Normal breath sounds. No wheezing, rhonchi or rales.  Abdominal:     General: There is no distension.     Palpations: Abdomen is soft. There is no mass.     Tenderness: There is no abdominal tenderness. There is no guarding or rebound.     Comments: Hypoactive bowel sounds x 4 quadrants  Musculoskeletal:        General: Normal range of motion.     Cervical back: Normal range of motion and neck supple. No tenderness.  Lymphadenopathy:     Cervical: No cervical adenopathy.  Skin:    General: Skin is warm and dry.  Neurological:     General: No focal deficit present.     Mental Status: She is alert and oriented to person, place, and time.  Psychiatric:        Mood and Affect: Mood normal.        Behavior: Behavior normal.     UC Treatments / Results  Labs (all labs ordered are listed, but only abnormal results are displayed) Labs Reviewed - No data to display  EKG   Radiology No results found.  Procedures Procedures (including critical care time)  Medications Ordered in UC Medications - No data to display  Initial Impression / Assessment and Plan / UC Course  I have reviewed the triage vital signs and the nursing notes.  Pertinent labs & imaging results that were available during my care of the patient were reviewed by me and considered in my medical decision making (see chart for details).     MDM: 1.  Constipation, unspecified constipation type-Rx'd Mag citrate. Advised/instructed patient to take medication as directed to completion.  Advised/encouraged patient to increase daily water intake to 32 to 48 ounces per  day 7 days/week.  Advised increase daily fiber intake to 40-50 grams per days.  Patient discharged home, hemodynamically stable. Final Clinical Impressions(s) / UC Diagnoses   Final diagnoses:  Constipation, unspecified constipation type     Discharge Instructions      Advised/instructed patient to take medication as directed to completion.  Advised/encouraged patient to increase daily water intake to 32 to 48 ounces per day 7 days/week.  Advised increase daily fiber intake to 40-50 grams per days.     ED Prescriptions     Medication Sig Dispense Auth. Provider   magnesium citrate SOLN Take 150 mL PO twice daily for three days 900 mL Eliezer Lofts, FNP      PDMP not reviewed this encounter.   Eliezer Lofts, [place] 10/25/20 1858

## 2020-10-25 NOTE — ED Triage Notes (Signed)
Pt c/o intermittent constipation x 2 weeks. Laxatives taken last week. Resolved but feeling constipated again. Some rectal bleeding when wiping. Denies abd pain/discomfort.

## 2020-11-01 ENCOUNTER — Other Ambulatory Visit: Payer: Self-pay

## 2020-11-01 ENCOUNTER — Other Ambulatory Visit (HOSPITAL_COMMUNITY): Admission: RE | Admit: 2020-11-01 | Payer: BLUE CROSS/BLUE SHIELD | Source: Ambulatory Visit

## 2020-11-01 ENCOUNTER — Emergency Department (INDEPENDENT_AMBULATORY_CARE_PROVIDER_SITE_OTHER)
Admission: EM | Admit: 2020-11-01 | Discharge: 2020-11-01 | Disposition: A | Discharge status: Home / Self Care | Payer: BLUE CROSS/BLUE SHIELD | Source: Home / Self Care | Attending: Family Medicine | Admitting: Family Medicine

## 2020-11-01 DIAGNOSIS — N760 Acute vaginitis: Secondary | ICD-10-CM

## 2020-11-01 MED ORDER — METRONIDAZOLE 500 MG PO TABS: 500.0000 mg | ORAL_TABLET | Freq: Two times a day (BID) | ORAL | 0 refills | Status: DC

## 2020-11-01 MED ORDER — FLUCONAZOLE 150 MG PO TABS: 150.0000 mg | ORAL_TABLET | Freq: Every day | ORAL | 0 refills | Status: DC

## 2020-11-01 NOTE — Discharge Instructions (Addendum)
Take the metronidazole 2 times a day for 7 days.  This is for BV.  Do not drink alcohol while on metronidazole Take Diflucan as directed.  Take 1 today, and then to take a second 1 at the end of the 7 days of metronidazole. Consider getting a probiotic for women.  These are available at any pharmacy.  Taking this daily will help reduce your risk of vaginal infections  Check MyChart for your test results.  You will be called if any of your results are positive

## 2020-11-01 NOTE — ED Provider Notes (Signed)
[name]    CSN: [id number] Arrival date & time: 11/01/20  1704      History   Chief Complaint Chief Complaint  Patient presents with   Vaginal Itching   Vaginal Discharge    HPI Teresa Daniel is a 25 y.o. female.   HPI  She has acute vaginitis.  She has odor that she associates with BV.  She also has itching in the thick discharge that she shows states with yeast.  She states she is prone to both of these infections and usually gets them both at the same time. We discussed recurring vaginitis.  Discussed hygiene, products, probiotics for women. Patient is menstruating currently and is certain she is not pregnant  Past Medical History:  Diagnosis Date   ADHD (attention deficit hyperactivity disorder)    Asthma    Back pain    Dysmenorrhea in the adolescent     Patient Active Problem List   Diagnosis Date Noted   Thoracic back pain 08/01/2016   Blurred vision 07/31/2016   Numbness and tingling 07/31/2016   Menstrual migraine 08/10/2014   Dysmenorrhea 01/03/2014   ADHD (attention deficit hyperactivity disorder) 06/09/2012   Asthma, chronic 06/09/2012    History reviewed. No pertinent surgical history.  OB History     Gravida  0   Para  0   Term  0   Preterm  0   AB  0   Living  0      SAB  0   IAB  0   Ectopic  0   Multiple  0   Live Births  0            Home Medications    Prior to Admission medications   Medication Sig Start Date End Date Taking? Authorizing Provider  fluconazole (DIFLUCAN) 150 MG tablet Take 1 tablet (150 mg total) by mouth daily. Repeat in 1 week if needed 11/01/20  Yes Raylene Everts, MD  metroNIDAZOLE (FLAGYL) 500 MG tablet Take 1 tablet (500 mg total) by mouth 2 (two) times daily. 11/01/20  Yes Raylene Everts, MD  ibuprofen (ADVIL) 600 MG tablet Take 1 tablet (600 mg total) by mouth every 6 (six) hours as needed. 07/25/20   Faustino Congress, NP  valACYclovir (VALTREX) 1000 MG tablet  Take 1 tablet (1,000 mg total) by mouth 2 (two) times daily. 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  Start taking as soon as symptoms occur. 10/04/20   Molpus, John, MD  fluticasone (FLONASE) 50 MCG/ACT nasal spray Place 2 sprays into both nostrils daily. 06/15/15 03/20/20  Evern Core, MD  gabapentin (NEURONTIN) 100 MG capsule Take 300 mg by mouth 3 (three) times daily.  09/30/19  [provider]  loratadine (CLARITIN) 10 MG tablet Take 1 tablet (10 mg total) by mouth daily. 06/15/15 03/20/20  Evern Core, MD  medroxyPROGESTERone (DEPO-PROVERA) 150 MG/ML injection INJECT 1 ML INTO THE MUSCLE EVERY 3 MONTHS 08/20/16 09/30/19  McDonell, Kyra Manges, MD    Family History Family History  Problem Relation Age of Onset   Hypertension Mother    Healthy Brother     Social History Social History   Tobacco Use   Smoking status: Never   Smokeless tobacco: Never  Vaping Use   Vaping Use: Never used  Substance Use Topics   Alcohol use: No   Drug use: No  Allergies   Codeine, Sulfa antibiotics, and Sulfasalazine   Review of Systems Review of Systems See HPI  Physical Exam Triage Vital Signs ED Triage Vitals  Enc Vitals Group     BP 11/01/20 1751 128/81     Pulse Rate 11/01/20 1751 68     Resp 11/01/20 1751 18     Temp 11/01/20 1751 98.2 F (36.8 C)     Temp Source 11/01/20 1751 Oral     SpO2 11/01/20 1751 100 %     Weight 11/01/20 1748 150 lb (68 kg)     Height 11/01/20 1748 '5\' 6"'$  (1.676 m)     Head Circumference --      Peak Flow --      Pain Score 11/01/20 1748 0     Pain Loc --      Pain Edu? --      Excl. in [place]? --    No data found.  Updated Vital Signs BP 128/81 (BP Location: Right Arm)   Pulse 68   Temp 98.2 F (36.8 C) (Oral)   Resp 18   Ht '5\' 6"'$  (1.676 m)   Wt 68 kg   LMP 10/30/2020   SpO2 100%   BMI 24.21 kg/m      Physical  Exam Constitutional:      General: She is not in acute distress.    Appearance: She is well-developed.  HENT:     Head: Normocephalic and atraumatic.     Mouth/Throat:     Comments: Mask is in place Eyes:     Conjunctiva/sclera: Conjunctivae normal.     Pupils: Pupils are equal, round, and reactive to light.  Cardiovascular:     Rate and Rhythm: Normal rate.  Pulmonary:     Effort: Pulmonary effort is normal. No respiratory distress.  Abdominal:     General: There is no distension.     Palpations: Abdomen is soft.  Genitourinary:    Comments: Technique for self swab was reviewed with patient Musculoskeletal:        General: Normal range of motion.     Cervical back: Normal range of motion.  Skin:    General: Skin is warm and dry.  Neurological:     Mental Status: She is alert.  Psychiatric:        Mood and Affect: Mood normal.        Behavior: Behavior normal.     UC Treatments / Results  Labs (all labs ordered are listed, but only abnormal results are displayed) Labs Reviewed  CERVICOVAGINAL ANCILLARY ONLY    EKG   Radiology No results found.  Procedures Procedures (including critical care time)  Medications Ordered in UC Medications - No data to display  Initial Impression / Assessment and Plan / UC Course  I have reviewed the triage vital signs and the nursing notes.  Pertinent labs & imaging results that were available during my care of the patient were reviewed by me and considered in my medical decision making (see chart for details).     See HPI.  We will treat for the acute vaginitis.  Test for STD. Final Clinical Impressions(s) / UC Diagnoses   Final diagnoses:  Acute vaginitis     Discharge Instructions      Take the metronidazole 2 times a day for 7 days.  This is for BV.  Do not drink alcohol while on metronidazole Take Diflucan as directed.  Take 1 today, and then to take a second 1  at the end of the 7 days of  metronidazole. Consider getting a probiotic for women.  These are available at any pharmacy.  Taking this daily will help reduce your risk of vaginal infections  Check MyChart for your test results.  You will be called if any of your results are positive   ED Prescriptions     Medication Sig Dispense Auth. Provider   fluconazole (DIFLUCAN) 150 MG tablet Take 1 tablet (150 mg total) by mouth daily. Repeat in 1 week if needed 2 tablet Raylene Everts, MD   metroNIDAZOLE (FLAGYL) 500 MG tablet Take 1 tablet (500 mg total) by mouth 2 (two) times daily. 14 tablet Raylene Everts, MD      PDMP not reviewed this encounter.   Raylene Everts, MD 11/01/20 1919

## 2020-11-01 NOTE — ED Triage Notes (Signed)
Pt presents to Urgent Care with c/o vaginal itching and "whitish to brown" vaginal discharge. Reports taking Valtrex for herpes outbreak in July and developed vaginal itching the day after she stopped taking this. Was also diagnosed w/ BV in mid-July, but did not take medication as directed.

## 2020-11-05 LAB — CERVICOVAGINAL ANCILLARY ONLY
Bacterial Vaginitis (gardnerella): POSITIVE — AB
Chlamydia: NEGATIVE
Comment: NEGATIVE
Comment: NEGATIVE
Comment: NEGATIVE
Comment: NORMAL
Neisseria Gonorrhea: NEGATIVE
Trichomonas: NEGATIVE

## 2020-11-10 ENCOUNTER — Emergency Department (INDEPENDENT_AMBULATORY_CARE_PROVIDER_SITE_OTHER)
Admission: EM | Admit: 2020-11-10 | Discharge: 2020-11-10 | Disposition: A | Discharge status: Home / Self Care | Payer: BLUE CROSS/BLUE SHIELD | Source: Home / Self Care | Attending: Family Medicine | Admitting: Family Medicine

## 2020-11-10 ENCOUNTER — Other Ambulatory Visit: Payer: Self-pay

## 2020-11-10 DIAGNOSIS — R21 Rash and other nonspecific skin eruption: Secondary | ICD-10-CM

## 2020-11-10 MED ORDER — MUPIROCIN 2 % EX OINT: 1.0000 "application " | TOPICAL_OINTMENT | Freq: Two times a day (BID) | CUTANEOUS | 0 refills | Status: DC

## 2020-11-10 NOTE — Discharge Instructions (Addendum)
Apply mupirocin 2 times a day to irritated skin May use in future for any minor skin infections Return as needed

## 2020-11-10 NOTE — ED Triage Notes (Signed)
Pt presents to Urgent Care with c/o several "cysts" on her vagina which are draining purulent discharge; also c/o vaginal itching x 1 week. Denies having unprotected sex w/ new partner.

## 2020-11-10 NOTE — ED Provider Notes (Signed)
[name]    CSN: [id number] Arrival date & time: 11/10/20  0824      History   Chief Complaint Chief Complaint  Patient presents with   Vaginal Itching   vaginal lesions    HPI Teresa Daniel is a 25 y.o. female.   HPI  Patient dates she has sores in her vaginal area.  They do not hurt.  They have been present for a couple of days.  She thinks it is from shaving.  She wants to make sure its not herpes.  No vaginal discharge or irritation.  Past Medical History:  Diagnosis Date   ADHD (attention deficit hyperactivity disorder)    Asthma    Back pain    Dysmenorrhea in the adolescent     Patient Active Problem List   Diagnosis Date Noted   Thoracic back pain 08/01/2016   Blurred vision 07/31/2016   Numbness and tingling 07/31/2016   Menstrual migraine 08/10/2014   Dysmenorrhea 01/03/2014   ADHD (attention deficit hyperactivity disorder) 06/09/2012   Asthma, chronic 06/09/2012    History reviewed. No pertinent surgical history.  OB History     Gravida  0   Para  0   Term  0   Preterm  0   AB  0   Living  0      SAB  0   IAB  0   Ectopic  0   Multiple  0   Live Births  0            Home Medications    Prior to Admission medications   Medication Sig Start Date End Date Taking? Authorizing Provider  mupirocin ointment (BACTROBAN) 2 % Apply 1 application topically 2 (two) times daily. 11/10/20  Yes Raylene Everts, MD  ibuprofen (ADVIL) 600 MG tablet Take 1 tablet (600 mg total) by mouth every 6 (six) hours as needed. 07/25/20   Faustino Congress, NP  valACYclovir (VALTREX) 1000 MG tablet Take 1 tablet (1,000 mg total) by mouth 2 (two) times daily. 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  Start taking as soon as symptoms occur. 10/04/20   Molpus, John, MD  fluticasone (FLONASE) 50 MCG/ACT nasal spray Place 2 sprays into both  nostrils daily. 06/15/15 03/20/20  Evern Core, MD  gabapentin (NEURONTIN) 100 MG capsule Take 300 mg by mouth 3 (three) times daily.  09/30/19  [provider]  loratadine (CLARITIN) 10 MG tablet Take 1 tablet (10 mg total) by mouth daily. 06/15/15 03/20/20  Evern Core, MD  medroxyPROGESTERone (DEPO-PROVERA) 150 MG/ML injection INJECT 1 ML INTO THE MUSCLE EVERY 3 MONTHS 08/20/16 09/30/19  McDonell, Kyra Manges, MD    Family History Family History  Problem Relation Age of Onset   Hypertension Mother    Healthy Brother     Social History Social History   Tobacco Use   Smoking status: Never   Smokeless tobacco: Never  Vaping Use   Vaping Use: Never used  Substance Use Topics   Alcohol use: No   Drug use: No     Allergies   Codeine, Sulfa antibiotics, and Sulfasalazine   Review of Systems Review of Systems See HPI  Physical Exam Triage Vital Signs ED Triage Vitals  Enc Vitals Group     BP 11/10/20 0846 119/82     Pulse Rate 11/10/20 0846 75     Resp  11/10/20 0846 20     Temp 11/10/20 0846 98.2 F (36.8 C)     Temp Source 11/10/20 0846 Oral     SpO2 11/10/20 0846 99 %     Weight 11/10/20 0843 150 lb (68 kg)     Height 11/10/20 0843 '5\' 6"'$  (1.676 m)     Head Circumference --      Peak Flow --      Pain Score 11/10/20 0842 0     Pain Loc --      Pain Edu? --      Excl. in [place]? --    No data found.  Updated Vital Signs BP 119/82 (BP Location: Right Arm)   Pulse 75   Temp 98.2 F (36.8 C) (Oral)   Resp 20   Ht '5\' 6"'$  (1.676 m)   Wt 68 kg   LMP 10/30/2020   SpO2 99%   BMI 24.21 kg/m   Physical Exam Constitutional:      General: She is not in acute distress.    Appearance: Normal appearance. She is well-developed.  HENT:     Head: Normocephalic and atraumatic.     Nose:     Comments: Mask is in place Eyes:     Conjunctiva/sclera: Conjunctivae normal.     Pupils: Pupils are equal, round, and reactive to light.   Cardiovascular:     Rate and Rhythm: Normal rate.  Pulmonary:     Effort: Pulmonary effort is normal. No respiratory distress.  Abdominal:     General: There is no distension.     Palpations: Abdomen is soft.  Genitourinary:   Musculoskeletal:        General: Normal range of motion.     Cervical back: Normal range of motion.  Skin:    General: Skin is warm and dry.  Neurological:     Mental Status: She is alert.     UC Treatments / Results  Labs (all labs ordered are listed, but only abnormal results are displayed) Labs Reviewed - No data to display  EKG   Radiology No results found.  Procedures Procedures (including critical care time)  Medications Ordered in UC Medications - No data to display  Initial Impression / Assessment and Plan / UC Course  I have reviewed the triage vital signs and the nursing notes.  Pertinent labs & imaging results that were available during my care of the patient were reviewed by me and considered in my medical decision making (see chart for details).      Final Clinical Impressions(s) / UC Diagnoses   Final diagnoses:  Vaginal discharge     Discharge Instructions      Apply mupirocin 2 times a day to irritated skin May use in future for any minor skin infections Return as needed   ED Prescriptions     Medication Sig Dispense Auth. Provider   mupirocin ointment (BACTROBAN) 2 % Apply 1 application topically 2 (two) times daily. 22 g Raylene Everts, MD      PDMP not reviewed this encounter.   Raylene Everts, MD 11/10/20 [phone]

## 2021-02-26 ENCOUNTER — Emergency Department (INDEPENDENT_AMBULATORY_CARE_PROVIDER_SITE_OTHER)
Admission: EM | Admit: 2021-02-26 | Discharge: 2021-02-26 | Disposition: A | Discharge status: Home / Self Care | Payer: BLUE CROSS/BLUE SHIELD | Source: Home / Self Care | Attending: Family Medicine | Admitting: Family Medicine

## 2021-02-26 ENCOUNTER — Encounter: Payer: Self-pay | Admitting: Emergency Medicine

## 2021-02-26 DIAGNOSIS — B3731 Acute candidiasis of vulva and vagina: Secondary | ICD-10-CM | POA: Diagnosis not present

## 2021-02-26 MED ORDER — FLUCONAZOLE 150 MG PO TABS: ORAL_TABLET | ORAL | 0 refills | Status: DC

## 2021-02-26 NOTE — ED Triage Notes (Signed)
Patient presents to Urgent Care with complaints of vaginal itching and white discharge since 2 days. Patient reports finishing up yeast medication around Thanksgiving.

## 2021-02-26 NOTE — Discharge Instructions (Signed)
I am putting you on Diflucan for 6 weeks.  This is the standard treatment for yeast vaginitis that is recurrent.  If this fails, then you must get a specialty evaluation to culture the yeast to determine what type of yeast you have (typical versus atypical) Continue with boric acid treatment Continue with probiotic for women Follow-up with GYN specialty

## 2021-02-26 NOTE — ED Provider Notes (Signed)
[name]    CSN: [id number] Arrival date & time: 02/26/21  1734      History   Chief Complaint Chief Complaint  Patient presents with   Vaginal Itching   Vaginal Discharge    HPI Teresa Daniel is a 25 y.o. female.   HPI Patient is here for yeast infection.  She gets recurring yeast infections.  She has had this problem ever since she was 18.  She has been to a number of gynecologist.  She states she has been on more than 1 Diflucan medicine.  She states that she has been placed on high-dose Diflucan (500 mg?).  In spite of this she continues to have infections. I searched up-to-date and it appears there is an extended treatment for people with recurring infections.  1 pill every 72 hours x 3 doses then 1 pill weekly for 6 weeks. I explained to the patient that she must see GYN for follow-up.  She needs a Candida culture to see if she has one of the atypical strains She is using boric acid She is using a probiotic for women  Past Medical History:  Diagnosis Date   ADHD (attention deficit hyperactivity disorder)    Asthma    Back pain    Dysmenorrhea in the adolescent     Patient Active Problem List   Diagnosis Date Noted   Thoracic back pain 08/01/2016   Blurred vision 07/31/2016   Numbness and tingling 07/31/2016   Menstrual migraine 08/10/2014   Dysmenorrhea 01/03/2014   ADHD (attention deficit hyperactivity disorder) 06/09/2012   Asthma, chronic 06/09/2012    History reviewed. No pertinent surgical history.  OB History     Gravida  0   Para  0   Term  0   Preterm  0   AB  0   Living  0      SAB  0   IAB  0   Ectopic  0   Multiple  0   Live Births  0            Home Medications    Prior to Admission medications   Medication Sig Start Date End Date Taking? Authorizing Provider  fluconazole (DIFLUCAN) 150 MG tablet Take 1 pill every 72 hours for 3 doses.  After this take 1 pill a week for 6 weeks 02/26/21  Yes  Raylene Everts, MD  ibuprofen (ADVIL) 600 MG tablet Take 1 tablet (600 mg total) by mouth every 6 (six) hours as needed. 07/25/20   Faustino Congress, NP  mupirocin ointment (BACTROBAN) 2 % Apply 1 application topically 2 (two) times daily. 11/10/20   Raylene Everts, MD  valACYclovir (VALTREX) 1000 MG tablet Take 1 tablet (1,000 mg total) by mouth 2 (two) times daily. 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  Start taking as soon as symptoms occur. 10/04/20   Molpus, John, MD  fluticasone (FLONASE) 50 MCG/ACT nasal spray Place 2 sprays into both nostrils daily. 06/15/15 03/20/20  Evern Core, MD  gabapentin (NEURONTIN) 100 MG capsule Take 300 mg by mouth 3 (three) times daily.  09/30/19  [provider]  loratadine (CLARITIN) 10 MG tablet Take 1 tablet (10 mg total) by mouth daily. 06/15/15 03/20/20  Evern Core, MD  medroxyPROGESTERone (DEPO-PROVERA) 150 MG/ML injection INJECT 1 ML INTO THE MUSCLE EVERY 3 MONTHS 08/20/16 09/30/19  McDonell, Kyra Manges,  MD    Family History Family History  Problem Relation Age of Onset   Hypertension Mother    Healthy Brother     Social History Social History   Tobacco Use   Smoking status: Never   Smokeless tobacco: Never  Vaping Use   Vaping Use: Never used  Substance Use Topics   Alcohol use: No   Drug use: No     Allergies   Codeine, Sulfa antibiotics, and Sulfasalazine   Review of Systems Review of Systems See HPI  Physical Exam Triage Vital Signs ED Triage Vitals  Enc Vitals Group     BP 02/26/21 1802 133/83     Pulse Rate 02/26/21 1802 72     Resp 02/26/21 1802 14     Temp 02/26/21 1802 98.8 F (37.1 C)     Temp Source 02/26/21 1802 Oral     SpO2 02/26/21 1802 96 %     Weight --      Height --      Head Circumference --      Peak Flow --      Pain Score 02/26/21 1801 0     Pain Loc --      Pain  Edu? --      Excl. in [place]? --    No data found.  Updated Vital Signs BP 133/83 (BP Location: Left Arm)   Pulse 72   Temp 98.8 F (37.1 C) (Oral)   Resp 14   SpO2 96%     Physical Exam Constitutional:      General: She is not in acute distress.    Appearance: She is well-developed.     Comments: Exam by observation.  Discussion  HENT:     Head: Normocephalic and atraumatic.  Eyes:     Conjunctiva/sclera: Conjunctivae normal.     Pupils: Pupils are equal, round, and reactive to light.  Cardiovascular:     Rate and Rhythm: Normal rate.  Pulmonary:     Effort: Pulmonary effort is normal. No respiratory distress.  Abdominal:     General: There is no distension.     Palpations: Abdomen is soft.  Musculoskeletal:        General: Normal range of motion.     Cervical back: Normal range of motion.  Skin:    General: Skin is warm and dry.  Neurological:     Mental Status: She is alert.  Psychiatric:        Mood and Affect: Mood normal.        Behavior: Behavior normal.     UC Treatments / Results  Labs (all labs ordered are listed, but only abnormal results are displayed) Labs Reviewed - No data to display  EKG   Radiology No results found.  Procedures Procedures (including critical care time)  Medications Ordered in UC Medications - No data to display  Initial Impression / Assessment and Plan / UC Course  I have reviewed the triage vital signs and the nursing notes.  Pertinent labs & imaging results that were available during my care of the patient were reviewed by me and considered in my medical decision making (see chart for details).    Final Clinical Impressions(s) / UC Diagnoses   Final diagnoses:  Candidal vaginitis     Discharge Instructions      I am putting you on Diflucan for 6 weeks.  This is the standard treatment for yeast vaginitis that is recurrent.  If this fails, then you must  get a specialty evaluation to culture the yeast to  determine what type of yeast you have (typical versus atypical) Continue with boric acid treatment Continue with probiotic for women Follow-up with GYN specialty   ED Prescriptions     Medication Sig Dispense Auth. Provider   fluconazole (DIFLUCAN) 150 MG tablet Take 1 pill every 72 hours for 3 doses.  After this take 1 pill a week for 6 weeks 9 tablet Raylene Everts, MD      PDMP not reviewed this encounter.   Raylene Everts, MD 02/26/21 [phone]

## 2021-07-25 ENCOUNTER — Encounter: Payer: Self-pay | Admitting: *Deleted

## 2022-01-31 ENCOUNTER — Ambulatory Visit
Admission: EM | Admit: 2022-01-31 | Discharge: 2022-01-31 | Disposition: A | Discharge status: Home / Self Care | Payer: BLUE CROSS/BLUE SHIELD | Attending: Family Medicine | Admitting: Family Medicine

## 2022-01-31 ENCOUNTER — Encounter: Payer: Self-pay | Admitting: Emergency Medicine

## 2022-01-31 DIAGNOSIS — L0501 Pilonidal cyst with abscess: Secondary | ICD-10-CM | POA: Insufficient documentation

## 2022-01-31 MED ORDER — KETOROLAC TROMETHAMINE 60 MG/2ML IM SOLN
60.0000 mg | Freq: Once | INTRAMUSCULAR | Status: AC
  Administered 2022-01-31: 60 mg via INTRAMUSCULAR

## 2022-01-31 MED ORDER — DOXYCYCLINE HYCLATE 100 MG PO CAPS: ORAL_CAPSULE | ORAL | 0 refills | Status: DC

## 2022-01-31 NOTE — ED Provider Notes (Signed)
[name]    CSN: [id number] Arrival date & time: 01/31/22  1758      History   Chief Complaint Chief Complaint  Patient presents with   Abscess    HPI Teresa Daniel is a 26 y.o. female.   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  She states that she has had four previous episodes, and this one is not quite as severe.  There has been no drainage from the cyst.  She denies fevers, chills, and sweats, and feels well otherwise.  The history is provided by the patient.  Abscess Size:  3cm Abscess quality: fluctuance, itching, painful and redness   Abscess quality: not weeping   Red streaking: no   Duration:  1 week Progression:  Improving Pain details:    Quality:  Pressure   Severity:  Mild   Duration:  1 week   Timing:  Constant   Progression:  Improving Chronicity:  Recurrent Relieved by:  Nothing Exacerbated by: sitting. Ineffective treatments:  None tried Associated symptoms: no fatigue, no fever and no nausea   Risk factors: prior abscess     Past Medical History:  Diagnosis Date   ADHD (attention deficit hyperactivity disorder)    Asthma    Back pain    Dysmenorrhea in the adolescent     Patient Active Problem List   Diagnosis Date Noted   Thoracic back pain 08/01/2016   Blurred vision 07/31/2016   Numbness and tingling 07/31/2016   Menstrual migraine 08/10/2014   Dysmenorrhea 01/03/2014   ADHD (attention deficit hyperactivity disorder) 06/09/2012   Asthma, chronic 06/09/2012    History reviewed. No pertinent surgical history.  OB History     Gravida  0   Para  0   Term  0   Preterm  0   AB  0   Living  0      SAB  0   IAB  0   Ectopic  0   Multiple  0   Live Births  0            Home Medications    Prior to Admission medications   Medication Sig Start Date End Date Taking? Authorizing Provider  doxycycline (VIBRAMYCIN) 100 MG capsule Take one cap PO Q12hr with  food. 01/31/22  Yes Kandra Nicolas, MD  fluconazole (DIFLUCAN) 150 MG tablet Take 1 pill every 72 hours for 3 doses.  After this take 1 pill a week for 6 weeks 02/26/21   Raylene Everts, MD  ibuprofen (ADVIL) 600 MG tablet Take 1 tablet (600 mg total) by mouth every 6 (six) hours as needed. 07/25/20   Faustino Congress, NP  mupirocin ointment (BACTROBAN) 2 % Apply 1 application topically 2 (two) times daily. 11/10/20   Raylene Everts, MD  valACYclovir (VALTREX) 1000 MG tablet Take 1 tablet (1,000 mg total) by mouth 2 (two) times daily. 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  Start taking as soon as symptoms occur. 10/04/20   Molpus, John, MD  fluticasone (FLONASE) 50 MCG/ACT nasal spray Place 2 sprays into both nostrils daily. 06/15/15 03/20/20  Evern Core, MD  gabapentin (NEURONTIN) 100 MG capsule Take 300 mg by mouth 3 (three) times daily.  09/30/19  [provider]  loratadine (CLARITIN) 10 MG tablet Take 1 tablet (10 mg total) by mouth daily. 06/15/15  03/20/20  Evern Core, MD  medroxyPROGESTERone (DEPO-PROVERA) 150 MG/ML injection INJECT 1 ML INTO THE MUSCLE EVERY 3 MONTHS 08/20/16 09/30/19  McDonell, Kyra Manges, MD    Family History Family History  Problem Relation Age of Onset   Hypertension Mother    Healthy Brother     Social History Social History   Tobacco Use   Smoking status: Never   Smokeless tobacco: Never  Vaping Use   Vaping Use: Never used  Substance Use Topics   Alcohol use: No   Drug use: No     Allergies   Codeine, Sulfa antibiotics, and Sulfasalazine   Review of Systems Review of Systems  Constitutional:  Negative for chills, diaphoresis, fatigue and fever.  Gastrointestinal:  Negative for abdominal pain and nausea.  Skin:  Positive for color change.  All other systems reviewed and are negative.    Physical Exam Triage  Vital Signs ED Triage Vitals  Enc Vitals Group     BP 01/31/22 1908 120/81     Pulse Rate 01/31/22 1908 76     Resp 01/31/22 1908 18     Temp 01/31/22 1908 98.8 F (37.1 C)     Temp Source 01/31/22 1908 Oral     SpO2 01/31/22 1908 99 %     Weight 01/31/22 1909 150 lb (68 kg)     Height 01/31/22 1909 '5\' 6"'$  (1.676 m)     Head Circumference --      Peak Flow --      Pain Score 01/31/22 1909 10     Pain Loc --      Pain Edu? --      Excl. in [place]? --    No data found.  Updated Vital Signs BP 120/81 (BP Location: Left Arm)   Pulse 76   Temp 98.8 F (37.1 C) (Oral)   Resp 18   Ht '5\' 6"'$  (1.676 m)   Wt 68 kg   LMP 01/17/2022 (Exact Date)   SpO2 99%   BMI 24.21 kg/m   Visual Acuity Right Eye Distance:   Left Eye Distance:   Bilateral Distance:    Right Eye Near:   Left Eye Near:    Bilateral Near:     Physical Exam Vitals and nursing note reviewed. Exam conducted with a chaperone present.  Constitutional:      General: She is not in acute distress. HENT:     Head: Normocephalic.     Mouth/Throat:     Mouth: Mucous membranes are moist.  Eyes:     Pupils: Pupils are equal, round, and reactive to light.  Cardiovascular:     Rate and Rhythm: Normal rate.  Pulmonary:     Effort: Pulmonary effort is normal.  Skin:    General: Skin is warm and dry.          Comments: In the superior aspect of the intergluteal cleft there is a 2cm by 3cm fluctuant pilonidal abscess, with surrounding erythema and tenderness to palpation.  Neurological:     Mental Status: She is alert and oriented to person, place, and time.      UC Treatments / Results  Labs (all labs ordered are listed, but only abnormal results are displayed) Labs Reviewed  AEROBIC/ANAEROBIC CULTURE W GRAM STAIN (SURGICAL/DEEP WOUND)    EKG   Radiology No results found.  Procedures Procedures  Incise and drain cyst/abscess Risks and benefits of procedure explained to patient and verbal consent  obtained.  Using sterile technique, applied  topical refrigerant spray followed by local anesthesia with 2% lidocaine with epinephrine, and cleansed affected area with Betadine and saline. Identified the most fluctuant area of lesion and incised with #11 blade.  Expressed blood and purulent material.  Inserted Iodoform gauze packing.  Wound culture specimen obtained.  Bandage applied.  Patient tolerated well   Medications Ordered in UC Medications  ketorolac (TORADOL) injection 60 mg (60 mg Intramuscular Given 01/31/22 1957)    Initial Impression / Assessment and Plan / UC Course  I have reviewed the triage vital signs and the nursing notes.  Pertinent labs & imaging results that were available during my care of the patient were reviewed by me and considered in my medical decision making (see chart for details).    Administered Toradol '60mg'$  IM  Wound culture pending.  Begin doxycycline. Return tomorrow for follow-up and packing removal.  Final Clinical Impressions(s) / UC Diagnoses   Final diagnoses:  Pilonidal abscess     Discharge Instructions      Leave present bandage in place until follow-up visit tomorrow.  May take Tylenol or ibuprofen as needed for pain.  If unable to return tomorrow, remove bandage and packing together.  Then change bandage daily until healed.  After packing is removed, you may soak in a warm bath once or twice daily until healed.  If symptoms become significantly worse during the night or over the weekend, proceed to the local emergency room.       ED Prescriptions     Medication Sig Dispense Auth. Provider   doxycycline (VIBRAMYCIN) 100 MG capsule Take one cap PO Q12hr with food. 14 capsule Kandra Nicolas, MD         Kandra Nicolas, MD 01/31/22 2028

## 2022-01-31 NOTE — ED Triage Notes (Signed)
Patient c/o pilonidal cyst on buttocks x 1 week.  Extremely painful, tender to touch, redness.  Patient has taken Aleve and Tylenol for pain.

## 2022-01-31 NOTE — Discharge Instructions (Addendum)
Leave present bandage in place until follow-up visit tomorrow.  May take Tylenol or ibuprofen as needed for pain.  If unable to return tomorrow, remove bandage and packing together.  Then change bandage daily until healed.  After packing is removed, you may soak in a warm bath once or twice daily until healed.  If symptoms become significantly worse during the night or over the weekend, proceed to the local emergency room.

## 2022-02-01 ENCOUNTER — Telehealth: Payer: Self-pay | Admitting: Emergency Medicine

## 2022-02-01 NOTE — Telephone Encounter (Signed)
Attempted to contact patient to see how she was doing from her visit on yesterday.  No answer.

## 2022-02-01 NOTE — Telephone Encounter (Signed)
Call back to [facility] regarding questions about loss of  wick from pilonidal cyst. Chart review w/ Dr Meda Coffee by this RN. Okay that wick came out - no need for a follow up visit. Unable to leave a message. RN will attempt a follow up call on Sunday

## 2022-02-02 NOTE — Telephone Encounter (Signed)
Follow up call to Teresa Daniel regarding call from yesterday - information from previous call relayed. No need for return visit per Dr Meda Coffee

## 2022-02-03 ENCOUNTER — Ambulatory Visit: Payer: Self-pay

## 2022-02-04 LAB — AEROBIC/ANAEROBIC CULTURE W GRAM STAIN (SURGICAL/DEEP WOUND): Special Requests: NORMAL

## 2023-06-03 DIAGNOSIS — Z419 Encounter for procedure for purposes other than remedying health state, unspecified: Secondary | ICD-10-CM | POA: Diagnosis not present

## 2023-07-02 ENCOUNTER — Ambulatory Visit: Payer: Self-pay | Admitting: Family Medicine

## 2023-07-02 ENCOUNTER — Encounter: Payer: Self-pay | Admitting: Family Medicine

## 2023-07-02 VITALS — BP 124/85 | HR 80 | Temp 98.0°F | Ht 66.0 in | Wt 156.0 lb

## 2023-07-02 DIAGNOSIS — F909 Attention-deficit hyperactivity disorder, unspecified type: Secondary | ICD-10-CM

## 2023-07-02 NOTE — Progress Notes (Signed)
 New Patient Office Visit  Subjective   Patient ID: Teresa Daniel, female    DOB: 07-Jul-1995  Age: 28 y.o. 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 States that she needs help focusing. She has been diagnosed with ADHD in the past, as a child Not currently on treatment. She was last treated ~2017. Does not remember what medication she was on previously.   Outpatient Encounter Medications as of 07/02/2023  Medication Sig   [DISCONTINUED] ibuprofen (ADVIL) 600 MG tablet Take 1 tablet (600 mg total) by mouth every 6 (six) hours as needed.   [DISCONTINUED] doxycycline (VIBRAMYCIN) 100 MG capsule Take one cap PO Q12hr with food.   [DISCONTINUED] fluconazole (DIFLUCAN) 150 MG tablet Take 1 pill every 72 hours for 3 doses.  After this take 1 pill a week for 6 weeks   [DISCONTINUED] fluticasone (FLONASE) 50 MCG/ACT nasal spray Place 2 sprays into both nostrils daily.   [DISCONTINUED] gabapentin (NEURONTIN) 100 MG capsule Take 300 mg by mouth 3 (three) times daily.   [DISCONTINUED] loratadine (CLARITIN) 10 MG tablet Take 1 tablet (10 mg total) by mouth daily.   [DISCONTINUED] medroxyPROGESTERone (DEPO-PROVERA) 150 MG/ML injection INJECT 1 ML INTO THE MUSCLE EVERY 3 MONTHS   [DISCONTINUED] mupirocin ointment (BACTROBAN) 2 % Apply 1 application topically 2 (two) times daily.   [DISCONTINUED] valACYclovir (VALTREX) 1000 MG tablet Take 1 tablet (1,000 mg total) by mouth 2 (two) times daily.   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  Start taking as soon as symptoms occur.   No facility-administered encounter medications on file as of 07/02/2023.    Past  Medical History:  Diagnosis Date   ADHD (attention deficit hyperactivity disorder)    Asthma    Back pain    Dysmenorrhea in the adolescent     History reviewed. No pertinent surgical history.  Family History  Problem Relation Age of Onset   Hypertension Mother    Healthy Brother     Social History   Socioeconomic History   Marital status: Single    Spouse name: Not on file   Number of children: Not on file   Years of education: Not on file   Highest education level: Some college, no degree  Occupational History   Not on file  Tobacco Use   Smoking status: Never   Smokeless tobacco: Never  Vaping Use   Vaping status: Never Used  Substance and Sexual Activity   Alcohol use: No   Drug use: No   Sexual activity: Not on file  Other Topics Concern   Not on file  Social History Narrative   Not on file   Social Drivers of Health   Financial Resource Strain: Low Risk  (07/01/2023)   Overall Financial Resource Strain (CARDIA)    Difficulty of Paying Living Expenses: Not very hard  Food Insecurity: No Food Insecurity (07/01/2023)   Hunger Vital Sign    Worried About Running Out of Food in the Last Year: Never true    Ran Out of Food in the Last Year: Never true  Transportation Needs: No Transportation Needs (07/01/2023)  PRAPARE - Administrator, Civil Service (Medical): No    Lack of Transportation (Non-Medical): No  Physical Activity: Insufficiently Active (07/01/2023)   Exercise Vital Sign    Days of Exercise per Week: 2 days    Minutes of Exercise per Session: 50 min  Stress: Stress Concern Present (07/01/2023)   [employer] of Occupational Health - Occupational Stress Questionnaire    Feeling of Stress : Rather much  Social Connections: Unknown (07/01/2023)   Social Connection and Isolation Panel [NHANES]    Frequency of Communication with Friends and Family: Patient declined    Frequency of Social Gatherings with Friends and Family: Patient declined     Attends Religious Services: More than 4 times per year    Active Member of [employer] or Organizations: No    Attends Engineer, structural: Not on file    Marital Status: Never married  Intimate Partner Violence: Unknown (06/28/2021)   Received from [employer], [facility]   HITS    Physically Hurt: Not on file    Insult or Talk Down To: Not on file    Threaten Physical Harm: Not on file    Scream or Curse: Not on file    ROS As per HPI   Objective   BP 124/85   Pulse 80   Temp 98 F (36.7 C)   Ht 5\' 6"  (1.676 m)   Wt 156 lb (70.8 kg)   LMP 06/18/2023 (Exact Date)   SpO2 99%   BMI 25.18 kg/m   Physical Exam Constitutional:      General: She is awake. She is not in acute distress.    Appearance: Normal appearance. She is well-developed and well-groomed. She is not ill-appearing, toxic-appearing or diaphoretic.  Cardiovascular:     Rate and Rhythm: Normal rate and regular rhythm.     Pulses: Normal pulses.          Radial pulses are 2+ on the right side and 2+ on the left side.       Posterior tibial pulses are 2+ on the right side and 2+ on the left side.     Heart sounds: Normal heart sounds. No murmur heard.    No gallop.  Pulmonary:     Effort: Pulmonary effort is normal. No respiratory distress.     Breath sounds: Normal breath sounds. No stridor. No wheezing, rhonchi or rales.  Musculoskeletal:     Cervical back: Full passive range of motion without pain and neck supple.     Right lower leg: No edema.     Left lower leg: No edema.  Skin:    General: Skin is warm.     Capillary Refill: Capillary refill takes less than 2 seconds.  Neurological:     General: No focal deficit present.     Mental Status: She is alert, oriented to person, place, and time and easily aroused. Mental status is at baseline.     GCS: GCS eye subscore is 4. GCS verbal subscore is 5. GCS motor subscore is 6.     Motor: No weakness.  Psychiatric:        Attention and Perception:  Attention and perception normal.        Mood and Affect: Mood and affect normal.        Speech: Speech normal.        Behavior: Behavior normal. Behavior is cooperative.        Thought Content: Thought content normal. Thought content  does not include homicidal or suicidal ideation. Thought content does not include homicidal or suicidal plan.        Cognition and Memory: Cognition and memory normal.        Judgment: Judgment normal.       07/02/2023   11:34 AM 04/11/2013    3:22 PM  Depression screen PHQ 2/9  Decreased Interest 0 2  Down, Depressed, Hopeless 0 1  PHQ - 2 Score 0 3  Altered sleeping 0 2  Tired, decreased energy 1 1  Change in appetite 0 1  Feeling bad or failure about yourself  0 2  Trouble concentrating 0 0  Moving slowly or fidgety/restless 0 0  Suicidal thoughts 0 0  PHQ-9 Score 1 9  Difficult doing work/chores Not difficult at all       07/02/2023   11:35 AM  GAD 7 : Generalized Anxiety Score  Nervous, Anxious, on Edge 0  Control/stop worrying 0  Worry too much - different things 0  Trouble relaxing 0  Restless 0  Easily annoyed or irritable 0  Afraid - awful might happen 0  Total GAD 7 Score 0   Assessment & Plan:  1. Attention deficit hyperactivity disorder (ADHD), unspecified ADHD type (Primary) Referral placed as below to psychiatry for further evaluation and treatment of ADHD  - Ambulatory referral to Psychiatry  The above assessment and management plan was discussed with the patient. The patient verbalized understanding of and has agreed to the management plan using shared-decision making. Patient is aware to call the clinic if they develop any new symptoms or if symptoms fail to improve or worsen. Patient is aware when to return to the clinic for a follow-up visit. Patient educated on when it is appropriate to go to the emergency department.   Return in about 3 months (around 10/01/2023) for CPE.   Neale Burly, DNP-FNP [facility] [address] [place], [place] [address] [phone]

## 2023-07-04 DIAGNOSIS — Z419 Encounter for procedure for purposes other than remedying health state, unspecified: Secondary | ICD-10-CM | POA: Diagnosis not present

## 2023-07-21 ENCOUNTER — Encounter: Payer: Self-pay | Admitting: *Deleted

## 2023-08-03 DIAGNOSIS — Z419 Encounter for procedure for purposes other than remedying health state, unspecified: Secondary | ICD-10-CM | POA: Diagnosis not present

## 2023-09-03 DIAGNOSIS — Z419 Encounter for procedure for purposes other than remedying health state, unspecified: Secondary | ICD-10-CM | POA: Diagnosis not present

## 2023-10-03 DIAGNOSIS — Z419 Encounter for procedure for purposes other than remedying health state, unspecified: Secondary | ICD-10-CM | POA: Diagnosis not present

## 2023-10-09 ENCOUNTER — Encounter: Admitting: Family Medicine

## 2023-10-09 ENCOUNTER — Encounter: Admitting: Nurse Practitioner

## 2023-11-02 ENCOUNTER — Encounter: Admitting: Nurse Practitioner

## 2023-11-03 DIAGNOSIS — Z419 Encounter for procedure for purposes other than remedying health state, unspecified: Secondary | ICD-10-CM | POA: Diagnosis not present

## 2023-11-24 ENCOUNTER — Ambulatory Visit (INDEPENDENT_AMBULATORY_CARE_PROVIDER_SITE_OTHER): Admitting: Nurse Practitioner

## 2023-11-24 ENCOUNTER — Encounter: Payer: Self-pay | Admitting: Nurse Practitioner

## 2023-11-24 VITALS — BP 113/73 | HR 73 | Temp 97.7°F | Ht 66.0 in | Wt 154.0 lb

## 2023-11-24 DIAGNOSIS — Z23 Encounter for immunization: Secondary | ICD-10-CM | POA: Diagnosis not present

## 2023-11-24 DIAGNOSIS — Z0000 Encounter for general adult medical examination without abnormal findings: Secondary | ICD-10-CM | POA: Diagnosis not present

## 2023-11-24 NOTE — Patient Instructions (Signed)
 Exercising to Stay Healthy To become healthy and stay healthy, it is recommended that you do moderate-intensity and vigorous-intensity exercise. You can tell that you are exercising at a moderate intensity if your heart starts beating faster and you start breathing faster but can still hold a conversation. You can tell that you are exercising at a vigorous intensity if you are breathing much harder and faster and cannot hold a conversation while exercising. How can exercise benefit me? Exercising regularly is important. It has many health benefits, such as: Improving overall fitness, flexibility, and endurance. Increasing bone density. Helping with weight control. Decreasing body fat. Increasing muscle strength and endurance. Reducing stress and tension, anxiety, depression, or anger. Improving overall health. What guidelines should I follow while exercising? Before you start a new exercise program, talk with your health care provider. Do not exercise so much that you hurt yourself, feel dizzy, or get very short of breath. Wear comfortable clothes and wear shoes with good support. Drink plenty of water while you exercise to prevent dehydration or heat stroke. Work out until your breathing and your heartbeat get faster (moderate intensity). How often should I exercise? Choose an activity that you enjoy, and set realistic goals. Your health care provider can help you make an activity plan that is individually designed and works best for you. Exercise regularly as told by your health care provider. This may include: Doing strength training two times a week, such as: Lifting weights. Using resistance bands. Push-ups. Sit-ups. Yoga. Doing a certain intensity of exercise for a given amount of time. Choose from these options: A total of 150 minutes of moderate-intensity exercise every week. A total of 75 minutes of vigorous-intensity exercise every week. A mix of moderate-intensity and  vigorous-intensity exercise every week. Children, pregnant women, people who have not exercised regularly, people who are overweight, and older adults may need to talk with a health care provider about what activities are safe to perform. If you have a medical condition, be sure to talk with your health care provider before you start a new exercise program. What are some exercise ideas? Moderate-intensity exercise ideas include: Walking 1 mile (1.6 km) in about 15 minutes. Biking. Hiking. Golfing. Dancing. Water aerobics. Vigorous-intensity exercise ideas include: Walking 4.5 miles (7.2 km) or more in about 1 hour. Jogging or running 5 miles (8 km) in about 1 hour. Biking 10 miles (16.1 km) or more in about 1 hour. Lap swimming. Roller-skating or in-line skating. Cross-country skiing. Vigorous competitive sports, such as football, basketball, and soccer. Jumping rope. Aerobic dancing. What are some everyday activities that can help me get exercise? Yard work, such as: Child psychotherapist. Raking and bagging leaves. Washing your car. Pushing a stroller. Shoveling snow. Gardening. Washing windows or floors. How can I be more active in my day-to-day activities? Use stairs instead of an elevator. Take a walk during your lunch break. If you drive, park your car farther away from your work or school. If you take public transportation, get off one stop early and walk the rest of the way. Stand up or walk around during all of your indoor phone calls. Get up, stretch, and walk around every 30 minutes throughout the day. Enjoy exercise with a friend. Support to continue exercising will help you keep a regular routine of activity. Where to find more information You can find more information about exercising to stay healthy from: [place] Department of Health and Human Services: ThisPath.fi Centers for Disease Control and Prevention (  CDC): [employer].br Summary Exercising regularly is  important. It will improve your overall fitness, flexibility, and endurance. Regular exercise will also improve your overall health. It can help you control your weight, reduce stress, and improve your bone density. Do not exercise so much that you hurt yourself, feel dizzy, or get very short of breath. Before you start a new exercise program, talk with your health care provider. This information is not intended to replace advice given to you by your health care provider. Make sure you discuss any questions you have with your health care provider. Document Revised: 07/06/2020 Document Reviewed: 07/06/2020 Elsevier Patient Education  2024 [employer].

## 2023-11-24 NOTE — Progress Notes (Signed)
 Subjective:    Patient ID: Teresa Daniel, female    DOB: 16-Oct-1995, 28 y.o.   MRN: [id number]   Chief Complaint: Annual Exam   HPI  Patient here today for annual physical exam. She is on no chronic meds and has no chronic medical problems. She does not want PAP today, she says she had one about 2 years ago. Have no record of PAP in chart. No complaints today. 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  Eyes:  Negative for pain.  Respiratory:  Negative for shortness of breath.   Cardiovascular:  Negative for chest pain, palpitations and leg swelling.  Gastrointestinal:  Negative for abdominal pain.  Endocrine: Negative for polydipsia.  Skin:  Negative for rash.  Neurological:  Negative for dizziness, weakness and headaches.  Hematological:  Does not bruise/bleed easily.  All other systems reviewed and are negative.      Objective:   Physical Exam Vitals and nursing note reviewed.  Constitutional:      General: She is not in acute distress.    Appearance: Normal appearance. She is well-developed.  HENT:     Head: Normocephalic.     Right Ear: Tympanic membrane normal.     Left Ear: Tympanic membrane normal.     Nose: Nose normal.     Mouth/Throat:     Mouth: Mucous membranes are moist.  Eyes:     Pupils: Pupils are equal, round, and reactive to light.  Neck:     Vascular: No carotid bruit or JVD.  Cardiovascular:     Rate and Rhythm: Normal rate and regular rhythm.     Heart sounds: Normal heart sounds.  Pulmonary:     Effort: Pulmonary effort is normal. No respiratory distress.     Breath sounds: Normal breath sounds. No wheezing or  rales.  Chest:     Chest wall: No tenderness.  Abdominal:     General: Bowel sounds are normal. There is no distension or abdominal bruit.     Palpations: Abdomen is soft. There is no hepatomegaly, splenomegaly, mass or pulsatile mass.     Tenderness: There is no abdominal tenderness.  Musculoskeletal:        General: Normal range of motion.     Cervical back: Normal range of motion and neck supple.  Lymphadenopathy:     Cervical: No cervical adenopathy.  Skin:    General: Skin is warm and dry.  Neurological:     Mental Status: She is alert and oriented to person, place, and time.     Deep Tendon Reflexes: Reflexes are normal and symmetric.  Psychiatric:        Behavior: Behavior normal.        Thought Content: Thought content normal.        Judgment: Judgment normal.     BP 113/73   Pulse 73   Temp 97.7 F (36.5 C) (Temporal)   Ht 5' 6 (1.676 m)   Wt 154 lb (69.9 kg)   SpO2 99%   BMI 24.86 kg/m        Assessment & Plan:  Teresa Daniel comes in today with chief complaint of Annual  Exam   Diagnosis and orders addressed:  1. Annual physical exam (Primary) Exercise to stay healthy Needs to schedule PAP - CBC with Differential/Platelet - CMP14+EGFR - Lipid panel - Thyroid  Panel With TSH - VITAMIN D  25 Hydroxy (Vit-D Deficiency, Fractures)   Labs pending Health Maintenance reviewed Diet and exercise encouraged  Follow up plan: 1 year and prn   Teresa Gladis, FNP

## 2023-11-25 ENCOUNTER — Other Ambulatory Visit

## 2023-11-27 ENCOUNTER — Other Ambulatory Visit

## 2023-11-28 LAB — CBC WITH DIFFERENTIAL/PLATELET
Basophils Absolute: 0 x10E3/uL (ref 0.0–0.2)
Basos: 0 %
EOS (ABSOLUTE): 0.2 x10E3/uL (ref 0.0–0.4)
Eos: 4 %
Hematocrit: 42.3 % (ref 34.0–46.6)
Hemoglobin: 13.2 g/dL (ref 11.1–15.9)
Immature Grans (Abs): 0 x10E3/uL (ref 0.0–0.1)
Immature Granulocytes: 0 %
Lymphocytes Absolute: 1.6 x10E3/uL (ref 0.7–3.1)
Lymphs: 36 %
MCH: 27.1 pg (ref 26.6–33.0)
MCHC: 31.2 g/dL — ABNORMAL LOW (ref 31.5–35.7)
MCV: 87 fL (ref 79–97)
Monocytes Absolute: 0.3 x10E3/uL (ref 0.1–0.9)
Monocytes: 6 %
Neutrophils Absolute: 2.4 x10E3/uL (ref 1.4–7.0)
Neutrophils: 54 %
Platelets: 275 x10E3/uL (ref 150–450)
RBC: 4.87 x10E6/uL (ref 3.77–5.28)
RDW: 12.2 % (ref 11.7–15.4)
WBC: 4.5 x10E3/uL (ref 3.4–10.8)

## 2023-11-28 LAB — CMP14+EGFR
ALT: 7 IU/L (ref 0–32)
AST: 16 IU/L (ref 0–40)
Albumin: 4.7 g/dL (ref 4.0–5.0)
Alkaline Phosphatase: 62 IU/L (ref 44–121)
BUN/Creatinine Ratio: 16 (ref 9–23)
BUN: 13 mg/dL (ref 6–20)
Bilirubin Total: 1.8 mg/dL — ABNORMAL HIGH (ref 0.0–1.2)
CO2: 21 mmol/L (ref 20–29)
Calcium: 9.5 mg/dL (ref 8.7–10.2)
Chloride: 103 mmol/L (ref 96–106)
Creatinine, Ser: 0.79 mg/dL (ref 0.57–1.00)
Globulin, Total: 2.5 g/dL (ref 1.5–4.5)
Glucose: 77 mg/dL (ref 70–99)
Potassium: 4.3 mmol/L (ref 3.5–5.2)
Sodium: 139 mmol/L (ref 134–144)
Total Protein: 7.2 g/dL (ref 6.0–8.5)
eGFR: 106 mL/min/1.73 (ref >59)

## 2023-11-28 LAB — THYROID PANEL WITH TSH
Free Thyroxine Index: 2.1 (ref 1.2–4.9)
T3 Uptake Ratio: 28 % (ref 24–39)
T4, Total: 7.6 ug/dL (ref 4.5–12.0)
TSH: 0.492 u[IU]/mL (ref 0.450–4.500)

## 2023-11-28 LAB — LIPID PANEL
Chol/HDL Ratio: 5.6 ratio — ABNORMAL HIGH (ref 0.0–4.4)
Cholesterol, Total: 186 mg/dL (ref 100–199)
HDL: 33 mg/dL — ABNORMAL LOW (ref >39)
LDL Chol Calc (NIH): 142 mg/dL — ABNORMAL HIGH (ref 0–99)
Triglycerides: 61 mg/dL (ref 0–149)
VLDL Cholesterol Cal: 11 mg/dL (ref 5–40)

## 2023-11-28 LAB — VITAMIN D 25 HYDROXY (VIT D DEFICIENCY, FRACTURES): Vit D, 25-Hydroxy: 16.2 ng/mL — ABNORMAL LOW (ref 30.0–100.0)

## 2023-11-30 ENCOUNTER — Ambulatory Visit: Payer: Self-pay | Admitting: Nurse Practitioner

## 2023-12-04 DIAGNOSIS — Z419 Encounter for procedure for purposes other than remedying health state, unspecified: Secondary | ICD-10-CM | POA: Diagnosis not present

## 2023-12-11 ENCOUNTER — Encounter: Payer: Self-pay | Admitting: *Deleted

## 2023-12-15 ENCOUNTER — Ambulatory Visit
Admission: EM | Admit: 2023-12-15 | Discharge: 2023-12-15 | Disposition: A | Discharge status: Home / Self Care | Attending: Nurse Practitioner | Admitting: Nurse Practitioner

## 2023-12-15 ENCOUNTER — Encounter: Payer: Self-pay | Admitting: Emergency Medicine

## 2023-12-15 DIAGNOSIS — H6593 Unspecified nonsuppurative otitis media, bilateral: Secondary | ICD-10-CM

## 2023-12-15 DIAGNOSIS — H6993 Unspecified Eustachian tube disorder, bilateral: Secondary | ICD-10-CM

## 2023-12-15 MED ORDER — CETIRIZINE-PSEUDOEPHEDRINE ER 5-120 MG PO TB12: 1.0000 | ORAL_TABLET | Freq: Every day | ORAL | 0 refills | Status: AC

## 2023-12-15 MED ORDER — FLUTICASONE PROPIONATE 50 MCG/ACT NA SUSP: 2.0000 | Freq: Every day | NASAL | 0 refills | Status: AC

## 2023-12-15 NOTE — ED Provider Notes (Signed)
 [facility]    CSN: [id number] Arrival date & time: 12/15/23  1025      History   Chief Complaint No chief complaint on file.   HPI Teresa Daniel is a 28 y.o. female.   The history is provided by the patient.   Patient presents with a 1 day history of bilateral ear fullness, ear pressure pressure, and an aching feeling in her ears.  She also endorses headache.  She denies fever, chills, ear drainage, lightheadedness, dizziness, cough, abdominal pain, nausea, vomiting, or diarrhea.  Patient states that she did have nasal congestion and runny nose previously.  Patient with underlying history of asthma and seasonal allergies.  States that she does not take any allergy medications on a regular basis.  So far, states that she has taken Sudafed for her symptoms.  Past Medical History:  Diagnosis Date   ADHD (attention deficit hyperactivity disorder)    Asthma    Back pain    Dysmenorrhea in the adolescent     Patient Active Problem List   Diagnosis Date Noted   Asthma, chronic 06/09/2012    History reviewed. No pertinent surgical history.  OB History     Gravida  0   Para  0   Term  0   Preterm  0   AB  0   Living  0      SAB  0   IAB  0   Ectopic  0   Multiple  0   Live Births  0            Home Medications    Prior to Admission medications   Medication Sig Start Date End Date Taking? Authorizing Provider  gabapentin (NEURONTIN) 100 MG capsule Take 300 mg by mouth 3 (three) times daily.  09/30/19  [provider]  loratadine  (CLARITIN ) 10 MG tablet Take 1 tablet (10 mg total) by mouth daily. 06/15/15 03/20/20  Gnanasekaran, Kavithashree, MD  medroxyPROGESTERone  (DEPO-PROVERA ) 150 MG/ML injection INJECT 1 ML INTO THE MUSCLE EVERY 3 MONTHS 08/20/16 09/30/19  McDonell, Ronal Amble, MD    Family History Family History  Problem Relation Age of Onset   Hypertension Mother    Healthy Brother     Social History Social History    Tobacco Use   Smoking status: Never   Smokeless tobacco: Never  Vaping Use   Vaping status: Never Used  Substance Use Topics   Alcohol use: No   Drug use: No     Allergies   Codeine, Sulfa antibiotics, and Sulfasalazine   Review of Systems Review of Systems Per HPI  Physical Exam Triage Vital Signs ED Triage Vitals [12/15/23 1040]  Encounter Vitals Group     BP 138/82     Girls Systolic BP Percentile      Girls Diastolic BP Percentile      Boys Systolic BP Percentile      Boys Diastolic BP Percentile      Pulse Rate 70     Resp 18     Temp 98.4 F (36.9 C)     Temp Source Oral     SpO2 98 %     Weight      Height      Head Circumference      Peak Flow      Pain Score 0     Pain Loc      Pain Education      Exclude from Growth Chart  No data found.  Updated Vital Signs BP 138/82 (BP Location: Right Arm)   Pulse 70   Temp 98.4 F (36.9 C) (Oral)   Resp 18   LMP 12/12/2023 (Exact Date)   SpO2 98%   Visual Acuity Right Eye Distance:   Left Eye Distance:   Bilateral Distance:    Right Eye Near:   Left Eye Near:    Bilateral Near:     Physical Exam Vitals and nursing note reviewed.  Constitutional:      General: She is not in acute distress.    Appearance: Normal appearance.  HENT:     Head: Normocephalic.     Right Ear: Ear canal and external ear normal. A middle ear effusion is present.     Left Ear: Ear canal and external ear normal. A middle ear effusion is present.     Nose: Nose normal.     Mouth/Throat:     Mouth: Mucous membranes are moist.     Pharynx: No posterior oropharyngeal erythema.  Eyes:     Extraocular Movements: Extraocular movements intact.     Conjunctiva/sclera: Conjunctivae normal.     Pupils: Pupils are equal, round, and reactive to light.  Cardiovascular:     Rate and Rhythm: Normal rate and regular rhythm.     Pulses: Normal pulses.     Heart sounds: Normal heart sounds.  Pulmonary:     Effort: Pulmonary  effort is normal.     Breath sounds: Normal breath sounds.  Musculoskeletal:     Cervical back: Normal range of motion.  Skin:    General: Skin is warm and dry.  Neurological:     General: No focal deficit present.     Mental Status: She is alert and oriented to person, place, and time.  Psychiatric:        Mood and Affect: Mood normal.        Behavior: Behavior normal.      UC Treatments / Results  Labs (all labs ordered are listed, but only abnormal results are displayed) Labs Reviewed - No data to display  EKG   Radiology No results found.  Procedures Procedures (including critical care time)  Medications Ordered in UC Medications - No data to display  Initial Impression / Assessment and Plan / UC Course  I have reviewed the triage vital signs and the nursing notes.  Pertinent labs & imaging results that were available during my care of the patient were reviewed by me and considered in my medical decision making (see chart for details).  On exam, patient has bilateral middle ear effusions, worse on the right side.  Will treat patient for acute bilateral eustachian tube dysfunction with cetirizine  D5-1 20 mg and fluticasone  50 micro nasal spray.  Supportive care recommendations were provided and discussed with the patient to include over-the-counter analgesics, and warm compresses to the ear.  Discussed indications with patient regarding follow-up.  Patient was in agreement with this plan of care and verbalizes understanding.  All questions were answered.  Patient stable for discharge.   Final Clinical Impressions(s) / UC Diagnoses   Final diagnoses:  None   Discharge Instructions   None    ED Prescriptions   None    PDMP not reviewed this encounter.   Gilmer Etta PARAS, NP 12/15/23 1101

## 2023-12-15 NOTE — ED Triage Notes (Signed)
 Feels like something is stuck in both ears since last night.

## 2023-12-15 NOTE — Discharge Instructions (Signed)
 Take medication as prescribed. May take Tylenol  or Ibuprofen  for pain, fever, or general discomfort. Warm compresses to the affected ear help with comfort. Do not stick anything inside the ear while symptoms persist. Avoid getting water inside of the ear while symptoms persist. Follow-up if symptoms do not improve.

## 2024-01-03 DIAGNOSIS — Z419 Encounter for procedure for purposes other than remedying health state, unspecified: Secondary | ICD-10-CM | POA: Diagnosis not present

## 2024-01-13 DIAGNOSIS — F900 Attention-deficit hyperactivity disorder, predominantly inattentive type: Secondary | ICD-10-CM | POA: Diagnosis not present

## 2024-02-11 DIAGNOSIS — F900 Attention-deficit hyperactivity disorder, predominantly inattentive type: Secondary | ICD-10-CM | POA: Diagnosis not present

## 2024-02-26 ENCOUNTER — Ambulatory Visit: Admitting: Psychology

## 2024-02-26 DIAGNOSIS — F89 Unspecified disorder of psychological development: Secondary | ICD-10-CM | POA: Diagnosis not present

## 2024-02-26 NOTE — Progress Notes (Addendum)
 Date: 02/26/2024 Appointment Start Time: 9-10:20am (at 9:39am this evlauaro briefly left and rejoined the appointment secondary to audio issues that were occurring, as well as at 9:45pm this evaluator and Ms. Lemley switched from [employer] to [employer] secondary to ongoing audio issues) Duration: 80 minutes Provider: Frederic Fire, PsyD Type of Session: Initial Appointment for Evaluation  Location of Patient: Home Location of Provider: Provider's Home (private office) Type of Contact: Microsoft Teams video visit with audio and Caregility video visit with audio  Session Content:  Prior to proceeding with today's appointment, two pieces of identifying information were obtained from Hebah to verify identity. In addition, Verina's physical location at the time of this appointment was obtained. In the event of technical difficulties, Iness shared a phone number she could be reached at. Zoella and this provider participated in today's telepsychological service. Reizel denied anyone else being present in the room or on the virtual appointment.  The provider's role was explained to Roxana. The provider reviewed and discussed issues of confidentiality, privacy, and limits therein (e.g., reporting obligations). In addition to verbal informed consent, written informed consent for psychological services was obtained from Eulalie prior to the initial appointment. Written consent included information concerning the practice, financial arrangements, and confidentiality and patients' rights. Since the clinic is not a 24/7 crisis center, mental health emergency resources were shared, and the provider explained e-mail, voicemail, and/or other messaging systems should be utilized only for non-emergency reasons. This provider also explained that information obtained during appointments will be placed in their electronic medical record in a confidential manner. Aubreyana verbally acknowledged understanding of the  aforementioned and agreed to use mental health emergency resources discussed if needed. Moreover, Karlisha agreed information may be shared with other [facility] or their referring provider(s) as needed for coordination of care. By signing the new patient documents, Regene provided written consent for coordination of care. Cing verbally acknowledged understanding she is ultimately responsible for understanding her insurance benefits as it relates to reimbursement of telepsychological and in-person services. This provider also reviewed confidentiality, as it relates to telepsychological services, as well as the rationale for telepsychological services. This provider further explained that video should not be captured, photos should not be taken, nor should testing stimuli be copied or recorded as it would be a copyright violation. Reighlynn expressed understanding of the aforementioned, and verbally consented to proceed. Elowyn is aware of the limitations of teleheath visits and verbally consented to proceed.  Tamekia completed the psychiatric diagnostic evaluation (history, including past, family, social, and  psychiatric history; behavioral observations; and establishment of a provisional diagnosis). The evaluation was completed in 80 minutes. Code 09208 was billed.   Mental Status Examination:  Appearance:  neat Behavior: appropriate to circumstances Mood: euthymic Affect: mood congruent Speech: pressured Eye Contact: appropriate Psychomotor Activity: restless Thought Process: denies suicidal, homicidal, and self-harm ideation, plan and intent Content/Perceptual Disturbances: none Orientation: AAOx4 Cognition/Sensorium: inattentive Insight: fair Judgment: good  Provisional DSM-5 diagnosis(es):  F89 Unspecified Disorder of Psychological Development   Plan: Testing is expected to answer the question, does the individual meet criteria for ADHD when age, other mental health concerns (e.g., anxiety  and depression), and cognitive functioning are taken into consideration? Further testing is warranted because a diagnosis cannot be given solely based on current interview data (further data is required). Testing results are expected to answer the remaining diagnostic questions in order to provide an accurate diagnosis and assist in treatment planning with an expectation of improved clinical outcome. Kamilia  is currently scheduled for an appointment on 03/11/2024 at 9am via Caregility video visit with audio.       CONFIDENTIAL PSYCHOLOGICAL EVALUATION ______________________________________________________________________________ Name: Ms. Aprill Vassar Date of Birth:     Age: 28   SOURCE AND REASON FOR REFERRAL: Ms. Niveah Khouri was referred by Marry Lenis, FNP for an evaluation to ascertain if they meet the criteria for Attention Deficit/Hyperactivity Disorder (ADHD).    BACKGROUND INFORMATION AND PRESENTING PROBLEM: Ms. Merlyn Bollen is a 28 year old female who resides in  .  Ms. Martone reported she was diagnosed [with ADHD] as a child and that she need[s] to be diagnosed [with ADHD] as an adult to be treated. She endorsed the following ADHD-related symptoms:   Symptoms Frequency   Often Occasionally Infrequent/No Significant Issues  Inattention Criterion (DSM-5-TR)     Making careless mistakes and missing small details.   She initially denied experiencing significant issues with this symptom; However, through conversation she noted examples of how forgetfulness and inattention contribute to mistakes being made, as well as described herself as prone to missing details. She shared that the aforementioned has contributed to her placing importance on ensuring she double checks her work.    Being easily distracted by various stimuli and the mind often being elsewhere even when no apparent distractions exist.  She discussed that seemingly every two minutes  she is distracted by various stimuli (e.g., sounds, a door open[ing], notifications, and irrelevant thoughts), which contributes to an extended time being required to complete tasks and efforts to rush through them. She also discussed being prone to periods of hyperfocus that include time blindness while engaged in tasks she finds stimulating or interesting.     Trouble sustaining attention during conversations.  She reported a tendency to become distracted during conversations, adding that it takes a lot of effort to sustain her attention during them despite the importance she places on doing so.    Does not follow through on instructions and fails to finish tasks.  She endorsed regularly having task initiation and maintenance issues.    Difficulty organizing tasks and activities.  She shared that she commonly has clutter in her environment, trouble prioritizing and sticking to plans, and difficulty meeting deadlines, as well as frequently completes tasks in a haphazard manner. She noted how inattention and forgetfulness contribute to the difficulties.    Avoids, dislikes, or is reluctant to engage in tasks that require sustained mental effort. She described regularly putting off tasks until near a deadline, noting how she finds it very difficult to start sooner and to sustain her attention on the task without a sense of urgency being present. She further described how inattention contributes to this symptom and that this symptom causes stress.     Loses things necessary for tasks or activities.  She shared she often misplaces items.    Forgetful in daily activities.  She also shared that she is generally forgetful of various things (e.g., what someone said, what she wanted to say, to return phone calls and texts, appointments, and plans).     Hyperactivity and Impulsivity Criterion (DSM-5-TR)     Fidgets with hands or feet or squirms in seat. She discussed that she habitually and largely  unconsciously fidgets (e.g., do[ing] something with [her] mouth or hands, and play[ing] with [her] hair), adding that it helps to keep [her] at ease but occurs regardless of her mood state.     Leaves seat in situations in which remaining seated is expected.  She denied experiencing significant issues with this symptom.  Feelings of restlessness.   She denied experiencing significant issues with this symptom.  Difficulty playing or engaging in leisure activities quietly.   She denied experiencing significant issues with this symptom.  On the go or often acts as if driven by a motor.  She described how this symptom generally only occurs when she is last minute rushing through a task but is not a regular occurrence.    Talks excessively.  She stated she is prone to excessively talking, and that others have commented on it.    Interrupts others.  She also stated she is prone to interrupting others, especially if she is comfortable around the person and/or something grasps [her] mind. She noted that concerns she will forget what she wants to say also contribute to this symptom.     Impatience.    She denied experiencing significant issues with this symptom.  ADHD-Related Symptoms that Assist in Identifying ADHD but are not in the DSM-5-TR Jeanna, 2021, p. 6-12 and [phone])     Emotional dysregulation and overstimulation. She reported being prone to irritability and/or becoming overstimulated, noting how ADHD-related difficulties (e.g., forgetfulness) are a common reason she experiences emotional upset.     Making decisions impulsively.   She denied experiencing significant issues with this symptom.  Tending to drive much faster than others. She stated that she cannot drive without the cruise control on [her] car, as inattention can lead to her driving 84-79 miles per hour over the speed limit. She also stated she is prone to driving fast when running late as she is trying to get to her  destination faster.     Trouble following through on promises or commitments made to others.  She stated this difficulty frequently occurs, and indicated that forgetfulness contributes to it.     Trouble doing things in proper order or sequence.   She denied experiencing significant issues with this symptom.   She expressed a belief her ADHD-related symptoms are consistent and independent of mood but can be exacerbated by moods and situations. She noted that her mother indicated her ADHD-related symptoms became noticeable when she was in the second grade. She stated her coping and compensatory strategies include audio recording class materials so she can review it later, using AI to assist with prioritizing tasks and creating routines, utilizing calendars and reminders to reduce forgetfulness, and using cruise control to prevent speeding.             Frederic ONEIDA Fire, PsyD

## 2024-03-04 DIAGNOSIS — Z419 Encounter for procedure for purposes other than remedying health state, unspecified: Secondary | ICD-10-CM | POA: Diagnosis not present

## 2024-03-10 DIAGNOSIS — F900 Attention-deficit hyperactivity disorder, predominantly inattentive type: Secondary | ICD-10-CM | POA: Diagnosis not present

## 2024-03-10 NOTE — Progress Notes (Unsigned)
 Date: 03/11/2024   Appointment Start Time: *** Duration: *** minutes Provider: Frederic Fire, PsyD Type of Session: Testing Appointment for Evaluation  Location of Patient: {gbptloc:[id number]} Location of Provider: Provider's Home (private office) Type of Contact: Caregility video visit with audio  Session Content: Today's appointment was a telepsychological visit. Teresa Daniel is aware it is her responsibility to secure confidentiality on her end of the session. Prior to proceeding with today's appointment, Teresa Daniel's physical location at the time of this appointment was obtained as well a phone number she could be reached at in the event of technical difficulties. Teresa Daniel denied anyone else being present in the room or on the virtual appointment. This provider reviewed that video should not be captured, photos should not be taken, nor should testing stimuli be copied or recorded as it would be a copyright violation. Teresa Daniel expressed understanding of the aforementioned, and verbally consented to proceed. The WAIS-5 was administered, scored, and interpreted by this evaluator. Teresa Daniel is aware of the limitations of teleheath visits and verbally consented to proceed.   Teresa Daniel completed the psychiatric diagnostic evaluation (history, including past, family, social, and  psychiatric history; behavioral observations; and establishment of a provisional diagnosis). The evaluation was completed in *** minutes. Code 09208 was billed. See the evaluation below for additional information.   The billing codes for test administration, scoring, interpreting, and report writing will be input on the feedback appointment.   Provisional DSM-5 diagnosis(es):  F89 Unspecified Disorder of Psychological Development   Plan: Teresa Daniel was scheduled for a feedback appointment on *** at *** via Caregility video visit with audio.             Frederic ONEIDA Fire, PsyD

## 2024-03-11 ENCOUNTER — Ambulatory Visit: Admitting: Psychology

## 2024-03-11 DIAGNOSIS — F89 Unspecified disorder of psychological development: Secondary | ICD-10-CM

## 2024-03-18 ENCOUNTER — Ambulatory Visit: Admitting: Psychology

## 2024-03-18 DIAGNOSIS — F89 Unspecified disorder of psychological development: Secondary | ICD-10-CM | POA: Diagnosis not present

## 2024-03-18 NOTE — Progress Notes (Addendum)
 Date: 03/18/2024   Appointment Start Time: 9-9:20am (psychodiagnostic interview) and 9:20-9:45am (WAIS-5 administration) Duration: 45 minutes Provider: Frederic Fire, PsyD Type of Session: Testing Appointment for Evaluation  Location of Patient: Home Location of Provider: Provider's Home (private office) Type of Contact: Caregility video visit with audio  Session Content: Today's appointment was a telepsychological visit. Teresa Daniel is aware it is her responsibility to secure confidentiality on her end of the session. Prior to proceeding with today's appointment, Teresa Daniel's physical location at the time of this appointment was obtained as well a phone number she could be reached at in the event of technical difficulties. Teresa Daniel denied anyone else being present in the room or on the virtual appointment. This provider reviewed that video should not be captured, photos should not be taken, nor should testing stimuli be copied or recorded as it would be a copyright violation. Teresa Daniel expressed understanding of the aforementioned, and verbally consented to proceed. The WAIS-5 was administered, scored, and interpreted by this evaluator. Teresa Daniel is aware of the limitations of teleheath visits and verbally consented to proceed.  Teresa Daniel completed the psychiatric diagnostic evaluation (history, including past, family, social, and  psychiatric history; behavioral observations; and establishment of a provisional diagnosis). The evaluation was completed in 20 minutes. Code 09208 was billed. See the evaluation below for additional information.   Billing codes for report writing, scoring, interpreting, and test administration will be input on the feedback appointment.   Provisional DSM-5 diagnosis(es):  F89 Unspecified Disorder of Psychological Development   Plan: Teresa Daniel was scheduled for a feedback appointment on 03/31/2023 at 9am via Caregility video visit with audio.      CONFIDENTIAL PSYCHOLOGICAL  EVALUATION ______________________________________________________________________________ Name: Teresa Daniel Date of Birth:     Age: 28  SOURCE AND REASON FOR REFERRAL: Teresa Daniel was referred by Teresa Lenis, FNP for an evaluation to ascertain if they meet the criteria for Attention Deficit/Hyperactivity Disorder (ADHD).    BACKGROUND INFORMATION AND PRESENTING PROBLEM: Teresa Daniel is a 28 year old female who resides in [place] .  Teresa Daniel reported she was diagnosed [with ADHD] as a child and that she need[s] to be diagnosed [with ADHD] as an adult to be treated. She endorsed the following ADHD-related symptoms:   Symptoms Frequency   Often Occasionally Infrequent/No Significant Issues  Inattention Criterion (DSM-5-TR)     Making careless mistakes and missing small details.   She initially denied experiencing significant issues with this symptom; However, through conversation she noted examples of how forgetfulness and inattention contribute to mistakes being made, as well as described herself as prone to missing details. She shared that the aforementioned has contributed to her placing importance on ensuring she double checks her work.    Being easily distracted by various stimuli and the mind often being elsewhere even when no apparent distractions exist.  She discussed that seemingly every two minutes she is distracted by various stimuli (e.g., sounds, a door open[ing], notifications, and irrelevant thoughts), which contributes to an extended time being required to complete tasks and efforts to rush through them. She also discussed being prone to periods of hyperfocus that include time blindness while engaged in tasks she finds stimulating or interesting.     Trouble sustaining attention during conversations.  She reported a tendency to become distracted during conversations, adding that it takes a lot of effort to sustain her attention  during them despite the importance she places on doing so.    Does not follow through on instructions and fails  to finish tasks.  She endorsed regularly having task initiation and maintenance issues.    Difficulty organizing tasks and activities.  She shared that she commonly has clutter in her environment, trouble prioritizing and sticking to plans, and difficulty meeting deadlines, as well as frequently completes tasks in a haphazard manner. She noted how inattention and forgetfulness contribute to the difficulties.    Avoids, dislikes, or is reluctant to engage in tasks that require sustained mental effort. She described regularly putting off tasks until near a deadline, noting how she finds it very difficult to start sooner and to sustain her attention on the task without a sense of urgency being present. She further described how inattention contributes to this symptom and that this symptom causes stress.     Loses things necessary for tasks or activities.  She shared she often misplaces items.    Forgetful in daily activities.  She also shared that she is generally forgetful of various things (e.g., what someone said, what she wanted to say, to return phone calls and texts, appointments, and plans).     Hyperactivity and Impulsivity Criterion (DSM-5-TR)     Fidgets with hands or feet or squirms in seat. She discussed that she habitually and largely unconsciously fidgets (e.g., do[ing] something with [her] mouth or hands, and play[ing] with [her] hair), adding that it helps to keep [her] at ease but occurs regardless of her mood state.     Leaves seat in situations in which remaining seated is expected.   She denied experiencing significant issues with this symptom.  Feelings of restlessness.   She denied experiencing significant issues with this symptom.  Difficulty playing or engaging in leisure activities quietly.   She denied experiencing significant issues with this symptom.  On the  go or often acts as if driven by a motor.  She described how this symptom generally only occurs when she is last minute rushing through a task but is not a regular occurrence.    Talks excessively.  She stated she is prone to excessively talking, and that others have commented on it.    Interrupts others.  She also stated she is prone to interrupting others, especially if she is comfortable around the person and/or something grasps [her] mind. She noted that concerns she will forget what she wants to say also contribute to this symptom.     Impatience.    She denied experiencing significant issues with this symptom.  ADHD-Related Symptoms that Assist in Identifying ADHD but are not in the DSM-5-TR Teresa Daniel, 2021, p. 6-12 and [phone])     Emotional dysregulation and overstimulation. She reported being prone to irritability and/or becoming overstimulated, noting how ADHD-related difficulties (e.g., forgetfulness) are a common reason she experiences emotional upset.     Making decisions impulsively.   She denied experiencing significant issues with this symptom.  Tending to drive much faster than others. She stated that she cannot drive without the cruise control on [her] car, as inattention can lead to her driving 84-79 miles per hour over the speed limit. She also stated she is prone to driving fast when running late as she is trying to get to her destination faster.     Trouble following through on promises or commitments made to others.  She stated this difficulty frequently occurs, and indicated that forgetfulness contributes to it.     Trouble doing things in proper order or sequence.   She denied experiencing significant issues with this symptom.   Ms.  Daniel discussed a history of regularly tossing and turning in her sleep, occasional snoring, and fluctuating restfulness upon waking;   She expressed a belief her ADHD-related symptoms are consistent and independent of mood but can be  exacerbated by moods and situations. She noted that her mother indicated her ADHD-related symptoms became noticeable when she was in the second grade. She stated her coping and compensatory strategies include audio recording class materials so she can review it later, using AI to assist with prioritizing tasks and creating routines, utilizing calendars and reminders to reduce forgetfulness, and using cruise control to prevent speeding.  Teresa Daniel denied awareness of having ever experienced any developmental milestone delays, grade retention, learning disability diagnosis, or having an individualized education plan. However, she noted she utilized speech therapy services in elementary school for an unknown reason. She stated she utilized ADHD-related medication throughout schooling, which helped her to sustain her attention during class. She reported she obtained As and Bs in primary school, and As, Bs, and Cs in secondary schooling. She stated she graduated high school and is currently employed as a LAWYER. She denied having an employment disciplinary history and described her current employment as going well.   Teresa Daniel reported her medical history is unremarkable, and she denied ever experiencing seizures or head injuries. She also denied ever utilizing mental health services. She reported use of half of a Redbull approximately two nights a week during downtime at her employment to assist with keep[ing] [her] awake. She denied use of all other recreational and illicit substances. She also denied ever experiencing psychiatric hospitalization or meeting the full criteria for hypomanic or manic episode; depressive episode; generalized anxiety disorder; social anxiety disorder; obsessions and compulsions; eating disorder; psychosis; trauma- and stressor-related disorder; suicidal or homicidal ideation, plan, or intent; or legal involvement.  She denied awareness of any familial mental  health history.   Chart Review: Multiple notes since 2014 indicate she has a history of ADHD.  Per a 07/02/2023 appointment note, Teresa Der, FNP reported, Ms. 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 She further reported Ms. Mestre she has been diagnosed with  ADHD as a child. A referral was placed for an ADHD evaluation.           Frederic ONEIDA Fire, PsyD

## 2024-03-30 ENCOUNTER — Ambulatory Visit: Admitting: Psychology

## 2024-03-30 NOTE — Progress Notes (Signed)
 This clinical research associate contacted the patient via telephone to check-in as they did not present for the appointment. The patient was unable to be reached. A HIPAA-compliant voicemail was left noting this writer would stay on the appointment until 15 minutes after the scheduled appointment time, but after this time, the appointment would need to be rescheduled. This clinical research associate also sent the following secure email to her:  Hello. I tried to reach you via telephone during our scheduled appointment today at 9am. I see you completed the BRIEF-2A Self-Report version. At this point, the CNS Vital Signs and BRIEF-2A version that a person who knows you well fills out are all that remain. I sent a new version of the CNS Vital Signs, as the prior administration had expired. This version will expire in a week as well. I also sent a reminder email for the BRIEF-2A Informant version so it would appear at the top of your inbox.   Once those measures are completed, feel free to email me or give us  a call at [phone] to schedule a feedback appointment. If we have not heard back from you, and the remaining measures are not completed by 1/15, I will assume you are no longer interested in completing the evaluation and will work to finalize it with the information I have. However, I would likely be unable to give a definitive diagnostic conclusion given the missing information.   Please let me know if you have any questions or concerns.              Frederic ONEIDA Fire, PsyD

## 2024-04-21 ENCOUNTER — Ambulatory Visit (INDEPENDENT_AMBULATORY_CARE_PROVIDER_SITE_OTHER): Admitting: Psychology

## 2024-04-21 DIAGNOSIS — F900 Attention-deficit hyperactivity disorder, predominantly inattentive type: Secondary | ICD-10-CM

## 2024-04-21 NOTE — Progress Notes (Signed)
 Testing and Report Writing Information: The following measures  were administered, scored, and interpreted by this provider:  Generalized Anxiety Disorder-7 (GAD-7; 5 minutes), Patient Health Questionnaire-9 (PHQ-9; 5 minutes), Wechsler Adult Intelligence Scale-Fifth Edition (WAIS-5; 70 minutes), CNS Vital Signs (45 minutes), Adult Attention Deficit/Hyperactivity Disorder Self-Report Scale Checklist (ASRSv1.1; 15 minutes), Behavior Rating Inventory for Executive Function - 2A - Self Report (BRIEF 2A; 20 minutes) and Behavior Rating Inventory for Executive Function - 2A - Informant  (BRIEF-2A; 20 minutes). A total of 180 minutes was spent on the administration and scoring of the aforementioned measures. Codes 03863 and [phone] (5 units) were billed.  Please see the assessment for additional details. This provider completed the written report which includes integration of patient data, interpretation of standardized test results, interpretation of clinical data, review of information provided by Bailynn and any collateral information/documentation, and clinical decision making (255 minutes in total).  Feedback Appointment: Date: 04/21/2024 Appointment Start Time: 11:02am Duration: 45 minutes Provider: Frederic Fire, PsyD Type of Session: Feedback Appointment for Evaluation  Location of Patient: Home Location of Provider: Provider's Home (private office) Type of Contact: Caregility video visit with audio  Session Content: Today's appointment was a telepsychological visit due to COVID-19. Laylynn is aware it is her responsibility to secure confidentiality on her end of the session. She provided verbal consent to proceed with today's appointment. Prior to proceeding with today's appointment, Denasia's physical location at the time of this appointment was obtained as well a phone number she could be reached at in the event of technical difficulties. Keondria denied anyone else being present in the room or on the  virtual appointment. Leilyn is aware of the limitations of teleheath visits and verbally consented to proceed.  This provider and Arasely completed the interactive feedback session which includes reviewing the aforementioned measures, treatment recommendations, and diagnostic conclusions.   The interactive feedback session was completed today and a total of 45 minutes was spent on feedback. Code 03867 was billed for feedback session.   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 03/05/2024: 11:50-12:05pm. 03/18/2024: 10:15-10:50am. 03/20/24: 9:25-10:35am, 10:40-10:50am, and 1:20-1:25pm. 04/09/2024: 9:25-10:15am. 04/19/2024: 4:20-5:15pm. 8:15-8:30pm  (billing code 03866 [3 units - this evaluator purposely charged less billing units than the time requirements allotted])  Plan: Jazmyne provided verbal consent for her evaluation to be sent via e-mail. No further follow-up planned by this provider.       CONFIDENTIAL PSYCHOLOGICAL EVALUATION ______________________________________________________________________________ Name: Ms. Rilyn Holsapple Date of Birth: 1996-02-12    Age: 64 Dates of Evaluation: 02/26/2024, 03/18/2024, 03/30/2024, 04/06/2024, and 04/08/2024  SOURCE AND REASON FOR REFERRAL: Ms. Jameeka Marcy was referred by Marry Lenis, FNP for an evaluation to ascertain if she meets criteria for Attention Deficit/Hyperactivity Disorder (ADHD).   EVALUATIVE PROCEDURES: Clinical Interview with Ms. Karalyne Portman (02/26/2024 and 03/18/2024) Wechsler Adult Intelligence Scale-Fourth Edition (WAIS-5; 03/18/2024) CNS Vital Signs (04/08/2024) Adult Attention Deficit/Hyperactivity Disorder Self-Report Scale Checklist  (04/08/2024) Behavior Rating Inventory for Executive Function - A - Self Report Behavior Rating Inventory for Executive Function - 2A - Self Report (BRIEF-2A; 03/30/2024) and Informant (04/06/2024) Patient Health Questionnaire-9 (PHQ-9) Generalized Anxiety Disorder-7 (GAD-7)   BACKGROUND INFORMATION AND PRESENTING PROBLEM: Ms. Dove Gresham is a 29 year old female who resides in [place] .  Ms. Hyson reported she was diagnosed [with ADHD] as a child and that she need[s]  to be diagnosed [with ADHD] as an adult to be treated. She endorsed experiencing the following ADHD-related symptoms:   Symptoms Frequency   Often Occasionally Infrequent/No Significant Issues  Inattention Criterion (DSM-5-TR)     Making careless mistakes and missing small details.   She initially denied experiencing significant issues with this symptom; however, through conversation she noted examples of forgetfulness and inattention contributing to mistakes being made, as well as described herself as prone to missing details. She shared that the aforementioned has contributed to her placing importance on ensuring she double checks her work.    Being easily distracted by various stimuli and the mind often being elsewhere even when no apparent distractions exist.  She discussed that seemingly every two minutes she is distracted by various stimuli (e.g., sounds, a door open[ing], notifications, and irrelevant thoughts), which contributes to an extended time being required to complete tasks and efforts to rush through them. She also discussed being prone to periods of hyperfocus that include time blindness while engaged in tasks she finds stimulating or interesting.     Trouble sustaining attention during conversations.  She reported a tendency to become distracted during conversations, adding that it takes a lot of effort to sustain her attention during them despite the importance she places on doing so.    Does not  follow through on instructions and fails to finish tasks.  She endorsed regularly having task initiation and maintenance issues.    Difficulty organizing tasks and activities.  She shared that she commonly has clutter in her environment, trouble prioritizing and sticking to plans, and difficulty meeting deadlines, as well as frequently completes tasks in a haphazard manner. She noted inattention and forgetfulness contribute to the difficulties.    Avoids, dislikes, or is reluctant to engage in tasks that require sustained mental effort. She described regularly putting off tasks until near a deadline, noting she finds it very difficult to start sooner and to sustain her attention on the task without a sense of urgency being present. She further described inattention contributes to this symptom and that this symptom causes stress.     Loses things necessary for tasks or activities.  She shared she often misplaces items.    Forgetful in daily activities.  She also shared that she is generally forgetful of various things (e.g., what someone said, what she wanted to say, to return phone calls and texts, appointments, and plans).     Hyperactivity and Impulsivity Criterion (DSM-5-TR)     Fidgets with hands or feet or squirms in seat. She discussed that she habitually and largely unconsciously fidgets (e.g., do[ing] something with [her] mouth or hands, and play[ing] with [her] hair), adding that it helps to keep [her] at ease but occurs regardless of her mood state.     Leaves seat in situations in which remaining seated is expected.   She denied experiencing significant issues with this symptom.  Feelings of restlessness.   She denied experiencing significant issues with this symptom.  Difficulty playing or engaging in leisure activities quietly.   She denied experiencing significant issues with this symptom.  On the go or often acts as if driven by a motor.  She described this symptom generally  only occurs when she is last minute rushing through a task but is not a regular occurrence.    Talks excessively.  She stated she is prone to excessively talking, and that others have commented on it.    Interrupts others.  She also stated she  is prone to interrupting others, especially if she is comfortable around the person and/or something grasps [her] mind. She noted that concerns she will forget what she wants to say also contribute to this symptom.     Impatience.    She denied experiencing significant issues with this symptom.  ADHD-Related Symptoms that Assist in Identifying ADHD but are not in the DSM-5-TR Jeanna, 2021, p. 6-12 and [phone])     Emotional dysregulation and overstimulation. She reported being prone to irritability and/or becoming overstimulated, noting ADHD-related difficulties (e.g., forgetfulness) are a common reason she experiences emotional upset.     Making decisions impulsively.   She denied experiencing significant issues with this symptom.  Tending to drive much faster than others. She stated that she cannot drive without the cruise control on [her] car, as inattention can lead to her driving 84-79 miles per hour over the speed limit. She also stated she is prone to driving fast when running late as she is trying to get to her destination faster.     Trouble following through on promises or commitments made to others.  She stated this difficulty frequently occurs, and indicated that forgetfulness contributes to it.     Trouble doing things in proper order or sequence.   She denied experiencing significant issues with this symptom.   Ms. Kamaka discussed a history of regularly tossing and turning in her sleep, occasional snoring, and fluctuating restfulness upon waking. She expressed a belief her ADHD-related symptoms are consistent and independent of mood but can be exacerbated by moods and situations. She noted that her mother indicated her ADHD-related  symptoms became noticeable when she was in the second grade. She stated her current coping and compensatory strategies include audio recording class materials so she can review it later, using AI to assist with prioritizing tasks and creating routines, utilizing calendars and reminders to reduce forgetfulness, and using cruise control to prevent speeding.  Ms. Cheong denied awareness of having ever experienced any developmental milestone delays, grade retention, learning disability diagnosis, or having an individualized education plan. However, she noted she utilized speech therapy services in elementary school for an unknown reason. She stated she utilized ADHD-related medication throughout schooling, which helped her to sustain her attention during class. She reported she obtained As and Bs in primary school, and As, Bs, and Cs in secondary schooling. Ms. Laidlaw stated she graduated high school and is currently employed as a LAWYER. She denied having an employment disciplinary history and described her current employment as going well.   Ms. Difatta reported her medical history is unremarkable, and she denied ever experiencing seizures or head injuries. She also denied ever utilizing mental health services. She reported use of half of a Redbull approximately two nights a week during downtime at her employment to assist with keep[ing] [her] awake. She denied use of all other recreational and illicit substances. She also denied ever experiencing psychiatric hospitalization or meeting the full criteria for hypomanic or manic episode; depressive episode; generalized anxiety disorder; social anxiety disorder; obsessions and compulsions; eating disorder; psychosis; trauma- and stressor-related disorder; suicidal or homicidal ideation, plan, or intent; or legal involvement. She denied awareness of any familial mental health history.   Chart Review: Multiple notes since 2014 indicate she  has a history of ADHD.  Per a 07/02/2023 appointment note, Marry Der, FNP reported, Ms. 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 She further  reported Ms. Musselman has been diagnosed with  ADHD as a child. A referral was placed for an ADHD evaluation.   BEHAVIORAL OBSERVATIONS: Ms. Medico presented on time for the evaluation. She was well-groomed. She was oriented to the time, place, person, and purpose of the appointment. During the interview, Ms. Jabbour occasionally answered this evaluator's questions prior to them having been fully completed, and on multiple occasions she provided an answer to a verbally provided question that indicated details of the question were not fully heard and/or comprehended. During the evaluation, Ms. Smits verbalized and/or demonstrated restlessness (e.g., frequently adjusted her sitting position), working memory-related problems (e.g., noting she had missed a detail of the instructions for a subtest which contributed to her providing an erroneous answer, indicating she had abruptly lost the digits she was trying to retain, and requiring this evaluator to repeat recently provided Similarities subtest words as she shared she had forgotten one of them), and impulsivity (e.g., making various facial reactions to subtest instructions and verbally provided stimuli). Throughout the evaluation, she maintained appropriate eye contact. Her thought processes and content were logical, coherent, and goal-directed. There were no overt signs of a thought disorder or perceptual disturbances, nor did she report such symptomatology. There was no evidence of paraphasias (i.e., errors in speech, gross mispronunciations, and word substitutions), repetition deficits, or disturbances in volume or prosody (i.e., rhythm and intonation). Overall, based on Ms. Romulus's approach to  testing, the current results are believed to be a fair estimate of her abilities.  PROCEDURAL CONSIDERATIONS: Psychological testing measures were conducted through a virtual visit with video and audio capabilities, but otherwise in a standard manner.   The Wechsler Adult Intelligence Scale, Fifth Edition (WAIS-5) was administered via remote telepractice using digital stimulus materials on [employer]'s Q-global system. The remote testing environment appeared free of distractions, adequate rapport was established with the examinee via video/audio capabilities, and Ms. Hefter appeared appropriately engaged in the task throughout the session. No significant technological problems or distractions were noted during administration. Modifications to the standardization procedure included: none. The WAIS-5 subtests, or similar tasks, have received initial validation in several samples for remote telepractice and digital format administration, and the results are considered a valid description of Ms. Saye's skills and abilities.  CLINICAL FINDINGS:  COGNITIVE FUNCTIONING  Wechsler Adult Intelligence Scale, Fourth Edition (WAIS-5): Ms. Brossart completed subtests of the WAIS-5, a full-scale measure of cognitive ability. The WAIS-5 comprises four indices that measure cognitive processes that comprise intellectual ability; however, only subtests from the Verbal Comprehension and Working Memory indices were administered. As a result, Full-Scale-IQ (FSIQ) and General Ability Index (GAI) could not be determined.   WAIS-5 Scale/Subtest Composite Score/Scaled Score 95% Confidence Interval Percentile Rank Qualitative Description  Verbal Comprehension (VCI) 89 83-97 23 Below Average  Similarities 9     Vocabulary 7     Working Memory (WMI) 67 62-77 1 Extremely Low  Digit Sequencing  6     Running Digits 3     Digits Forward 7       The Verbal Comprehension Index (VCI) measures one's ability to receive,  comprehend, and express language. It also measures the ability to retrieve previously learned information and to understand relationships between words and concepts presented orally. Ms. Heide obtained a VCI score of 19 (23rd percentile), placing her in the below average range compared to same-aged peers. Her performance on the subtests comprising this index was similar, which suggests her verbal reasoning abilities are comparably developed.  The Working Memory Index (WMI) measures one's ability to sustain attention, concentrate, and exert mental control. Ms. Maclachlan obtained a WMI score of 67 (1st percentile), placing her in the extremely low range compared to peers of the same age. The 22-point difference between her VCI and WMI scores is statistically significant at the .05 level, which suggests her ability to sustain attention and exert mental control are a weakness relative to her verbal reasoning abilities. Her performance on the subtests was diverse. Her best performance was on Digits Forward, and her lowest performance was on Running Digits. This performance pattern suggests she has a relative strength with simple registration compared to both registration and maintenance of information. It may also suggest that a slower pace benefited her auditory registration of information.   ATTENTION AND PROCESSING  CNS Vital Signs: The CNS Vital Signs assessment evaluates the neurocognitive status of an individual and covers a range of mental processes. The results of the CNS Vital Signs testing indicated very low neurocognitive processing ability. Attentional abilities and working memory were in the average range. Executive function and cognitive flexibility were very low.  Psychomotor speed, motor speed, processing speed, and reaction time ranged from low average to very low, which suggests impairment in hand-eye coordination, thinking speed, and responsiveness. Visual memory (images) was average, and  verbal memory (words) was very low, which indicates visual memory is a relative strength. The results suggest Ms. Eakins experiences impairment in verbal memory, psychomotor speed, reaction time, cognitive flexibility, executive function, and motor speed, and a weakness in processing speed.   Domain  Standard Score Percentile Validity Indicator Guideline  Neurocognitive Index 68 2 Yes Very Low  Composite Memory 63 1 Yes Very Low  Verbal Memory 42 1 Yes Very Low  Visual Memory 91 27 Yes Average  Psychomotor Speed 63 1 Yes Very Low  Reaction Time 49 1 Yes Very Low  Complex Attention 95 37 Yes Average  Cognitive Flexibility 68 2 Yes Very Low  Processing Speed  84 14 Yes Low Average  Executive Function 67 1 Yes Very Low  Working Memory 91 27 Yes Average  Sustained Attention 90 25 Yes Average  Simple Attention 108 70 Yes Average  Motor Speed 63 1 Yes Very Low   EXECUTIVE FUNCTION  Behavior Rating Inventory of Executive Function, Second Edition EVENT ORGANISER) Self-Report: Ms. Towers completed the Self-Report Form of the Behavior Rating Inventory of Executive Function-Adult Version, Second Edition KIMBERLY-CLARK), which has three domains that evaluate cognitive, behavioral, and emotional regulation, and a Global Executive Composite score provides an overall snapshot of executive functioning. There are no missing item responses in the protocol. The Negativity, Infrequency, and Inconsistency scales are not elevated, suggesting she did not respond to the protocol in an overly negative, haphazard, extreme, or inconsistent manner. In the context of these validity considerations, ratings of Ms. Kersten's everyday executive function suggest some areas of concern. The overall index score, the GEC, was within normal limits (GEC T = 54, %ile = 62). The Behavior Regulation Index (BRI), Emotion Regulation Index (ERI), and Cognitive Regulation Index (CRI) scores were within normal limits (BRI T = 56, %ile = 77;  ERI T = 45, %ile = 43; CRI T = 57, %ile = 70). Ms. Sailer indicated difficulty with her ability to sustain working memory. She did not describe her ability to resist impulses, be aware of her functioning in social settings, adjust well to changes, react to events appropriately, get going on tasks and activities and independently generate ideas,  plan and organize her approach to problem solving appropriately, be appropriately cautious in her approach to tasks and check for mistakes, and keep materials and belongings reasonably well-organized as problematic.   Scale/Index  Raw Score T Score Percentile Qualitative Description  Inhibit 14 59 84 Within Normal Limits  Self-Monitor 8 51 65 Within Normal Limits  Behavior Regulation Index (BRI) 22 56 77 Within Normal Limits  Shift 8 46 44 Within Normal Limits  Emotional Control 10 47 52 Within Normal Limits  Emotion Regulation Index (ERI) 18 45 43 Within Normal Limits  Initiate 13 52 58 Within Normal Limits  Working Memory 19 71 99 Moderately Elevated  Plan/Organize 11 47 49 Within Normal Limits  Task Monitor 9 50 59 Within Normal Limits  Organization of Materials 13 54 68 Within Normal Limits  Cognitive Regulation Index (CRI) 65 57 70 Within Normal Limits  Global Executive Composite (GEC) 105 54 62 Within Normal Limits   Validity Scale Raw Score Cumulative Percentile Protocol Classification  Inconsistency 4 98 Acceptable  Negativity 0 98 Acceptable  Infrequency 0 99 Acceptable   Behavior Rating Inventory of Executive Function, Second Edition KIMBERLY-CLARK) Informant:  Ms. Dreibelbis's mother, Ms. Alfonso Flake, completed the Informant Form of the Behavior Rating Inventory of Executive Function-Adult Version, Second Edition KIMBERLY-CLARK), which is equivalent to the Self-Report version and has three domains that evaluate cognitive, behavioral, and emotional regulation, and a Global Executive Composite score provides an overall snapshot of executive  functioning. There are no missing item responses in the protocol. The Negativity, Infrequency, and Inconsistency scales are not elevated, suggesting she did not respond to the protocol in an overly negative, haphazard, extreme, or inconsistent manner. In the context of these validity considerations, Ms. Alfonso Sommers's ratings of Ms. Evita Kise's everyday executive function suggest some areas of concern. The overall index score, the GEC, was highly elevated (GEC T = 90, %ile = >99). The Behavior Regulation Index (BRI), Emotion Regulation Index (ERI), and Cognitive Regulation Index (CRI) scores were all elevated (BRI T = 76, %ile = 99; ERI T = 86, %ile = >99, CRI T = 86, %ile = >99), suggesting self-regulatory problems in all measured domains. More specifically, Ms. Alfonso Flake indicated Ms. Yasemin Hank experiences difficulty with her ability to resist impulses, be aware of her functioning in social settings, adjust well to changes, react to events appropriately, get going on tasks and activities and independently generate ideas, sustain working memory, plan and organize her approach to problem solving appropriately, be appropriately cautious in her approach to tasks and check for mistakes, and keep materials and belongings reasonably well-organized. The elevated scores on the Shift and Emotional Control scales suggest Ms. Briannie Urieta is perceived as experiencing significant problem-solving rigidity combined with emotional dysregulation, which may leave her prone to losing emotional control when her routine or perspective is challenged and/or flexibility is required. Moreover, the elevated scores on scales reflecting problems with fundamental behavioral and/or emotional regulation (Inhibit, Emotional Control, and Shift) suggest that more global problems with self-regulation are having a negative effect on active cognitive problem solving (elevated CRI). Upon follow-up, Ms. Palla  indicated a belief that her mother's perception of her executive functioning issues is likely  more accurate than her own.  Scale/Index  Raw Score T Score Percentile Qualitative Description  Inhibit 17 69 97 Mildly Elevated  Self-Monitor 16 74 >99 Moderately Elevated  Behavior Regulation Index (BRI) 33 76 99 Highly Elevated  Shift 18 79 >99 Highly Elevated  Emotional Control 24  82 >99 Highly Elevated  Emotion Regulation Index (ERI) 42 86 >99 Highly Elevated  Initiate 22 83 >99 Highly Elevated  Working Memory 18 74 99 Moderately Elevated  Plan/Organize 23 86 >99 Highly Elevated  Task-Monitor 16 73 >99 Moderately Elevated  Organization of Materials 22 75 >99 Highly Elevated  Cognitive Regulation Index (CRI) 101 86 >99 Highly Elevated  Global Executive Composite (GEC) 176 90 >99 Highly Elevated   Validity Scale Raw Score Cumulative Percentile Protocol Classification  Inconsistency 4 99 Acceptable  Negativity 5 98 Acceptable  Infrequency 0 98 Acceptable   BEHAVIORAL FUNCTIONING   Patient Health Questionnaire-9 (PHQ-9): Ms. Ferrone completed the PHQ-9, a self-report measure that assesses symptoms of depression. She scored 4/27, which indicates minimal depression.   Generalized Anxiety Disorder-7 (GAD-7): Ms. Bowditch completed the GAD-7, a self-report measure that assesses symptoms of anxiety. She scored 0/21, which indicates minimal anxiety.   Adult ADHD Self-Report Scale Symptom Checklist (ASRS): Ms. Calix reported the following symptoms as sometimes: fidgeting or squirming, feeling overly active and compelled to do things, and difficulty relaxing. She endorsed the following symptoms as occurring often: struggling to concentrate on what people say even when they are speaking directly to her, leaving her seat when expected to stay seated, feeling restless or fidgety, difficulty waiting for her turn in turn-taking situations, and interrupting others when they are busy. She endorsed  the following symptoms as very often: difficulty wrapping up the final details of a project following the completion of challenging aspects, difficulty getting things in order when a task requires organization, problems remembering appointments or obligations, avoiding or delaying getting started on tasks requiring a lot of thought, making careless mistakes when working on boring or complex projects, struggling to sustain attention when doing boring or repetitive work, control and instrumentation engineer or has difficulty finding things, and being distracted by the noise around her. The endorsement of at least four items in Part A is highly consistent with ADHD in adults. The frequency scores of Part B provides additional cues. Ms. Mckendree scored 4/6 on Part A and 9/12 on Part B, which is considered a positive screening for ADHD.   SUMMARY AND CLINICAL IMPRESSIONS: Ms. Trulson is a 29 year old female who was referred by Marry Lenis, FNP for an evaluation to determine if she currently meets criteria for a diagnosis of Attention-Deficit/Hyperactivity Disorder (ADHD).   Ms. Davenport reported she was diagnosed [with ADHD] as a child and that she need[s] to be diagnosed [with ADHD] as an adult to be treated. She expressed a belief her ADHD-related symptoms are consistent and independent of mood and sleep patterns but can be exacerbated by moods and situations.  Ms. Dethlefs was administered assessments during the evaluation to measure her current cognitive abilities. Her verbal comprehension abilities were in the below average range, and comparably developed. Her ability to sustain attention, concentrate, and exert mental control was in the extremely low range. Her performance pattern on the subtests suggests she has a relative strength with simple registration compared to both registration and maintenance of information. It may also suggest that a slower pace benefited her auditory registration of information. Results of  the CNS Vital Signs indicated very low neurocognitive processing ability, with impairment in verbal memory, psychomotor speed, reaction time, cognitive flexibility, executive function, and motor speed, and a weakness in processing speed.  During the clinical interview and on self-report measures, Ms. Kundrat, and her mother, Ms. Alfonso Lucille, indicated Ms. Penman experiences executive functioning concerns that include attentional dysregulation, hyperactivity- and impulsivity-related  symptoms, and meeting the full criteria for ADHD. However, Ms. Engebretson's BRIEF-2A Self-Report results indicated she is largely not experiencing significant executive functioning issues (i.e., only the Working Memory scale was elevated). It is unclear what caused this discrepancy. While this discrepancy makes creating a diagnostic conclusion difficult, when considering self-reported symptoms during the clinical interview and on the ASRSv1.1; endorsed and/or demonstrated impairment or weakness on measures of attention, working memory, executive functioning, psychomotor speed, reaction time, and processing speed; Ms. 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 The specifier of Moderate as she endorsed symptoms in excess of what is needed to make the diagnosis and indicated they cause impairment or weakness in her academic (e.g., indicated she has trouble sustaining her attention in class when not utilizing ADHD-related medication), social (e.g., frequently engaging in excessive talking and interrupting of others), and daily (e.g., regularly experiencing being easily distracted, task initiation and completion issues, disorganization, and forgetfulness) functioning.   Ms. Farfan discussed endorsed a history of  regularly tossing and turning in her sleep, occasional snoring, and fluctuating restfulness upon waking. Additionally, the PHQ-9 and GAD-7 were administered. Her results suggest she experiences minimal depression- and anxiety-related symptoms. Given the limited scope of this evaluation, it was not possible to determine if the full criteria for sleep-wake disorder is met. Thus, she would likely benefit from evaluation of these symptoms to definitively rule in or out a sleep-wake disorder. Should a sleep-wake disorder be ruled in, it would likely be in addition to her diagnosis of ADHD, as she described her ADHD-related concerns as consistent across situations and independent of mood and sleep patterns. However, should a sleep-wake disorder be ruled in and treatment of it was to largely resolve her endorsed ADHD-related symptoms, that may indicate a sleep-wake disorder better explains her endorsed ADHD-related symptoms.   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 Kuchera would likely benefit from speaking with her medical care team about her endorsed sleep issues.  Ms. Canby would likely benefit from a consultation regarding medication for ADHD symptoms.   Individual therapeutic services may assist in processing a diagnosis of ADHD and discussing coping and compensatory strategies. Ms. Ewer would likely benefit from making use of strategies for ADHD symptoms:  Setting a timer to complete tasks. Break tasks into manageable chunks and spread them out over more extended periods with breaks.  Utilizing lists and day calendars to keep track of tasks.  Answering emails daily.  Improve listening skills by asking the speaker to give information in smaller chunks and asking for explanations and clarification as needed. Leaving more than the anticipated time to complete tasks. It may help to  keep tasks brief, well within your attention span, and a mix of both high and low-interest tasks. Tasks may be gradually increased in length. Practice proactive planning by setting aside time every evening to plan for the next day (e.g., prepare needed materials or pack the car the night before).  Learn how to make a practical and reasonable to-do list of important tasks and priorities and always keep it easily accessible. Make additional copies in case it is lost or misplaced. Utilize visual reminders by posting appointments, to-do lists, or schedules in strategic areas at home and work.  Practice using an appointment book, smartphone, or other tech device, or a daily planning calendar, and learn to write  down appointments and commitments immediately. Keep notepads or use a portable audio recorder to capture important ideas that would be beneficial to recall later. Learn and practice time management skills. Purchase a programmable alarm watch or set an alarm on a smartphone to avoid losing track of time.  Use a color-coded file system, desk and closet organizers, storage boxes, or other organization devices to reduce clutter and improve efficiency and structure.  Implement ways to become more aware of your actions and to inhibit or adjust them as warranted (e.g., reviewing videos of your actions, considering consequences of obeying or not obeying the rules of various upcoming situations, having a trusted other to discuss plans with, and/or provide cues to stop certain behaviors, and make visual cues for rules you would like to follow). The 4Rs: Read just one paragraph, recite out loud in a soft voice or whisper what was important in that material, write that material down in a notebook, then review what you just wrote. Stay flexible and be prepared to change your plans, as symptom breakthroughs and crises will likely occur periodically. Ms. Blanda may benefit from mindfulness training to address  symptoms of inattention.  Mental alertness/energy can be raised by increasing exercise; improving sleep; eating a healthy diet; and managing stress. Consulting with a physician regarding any changes to the physical regimen is recommended. Failing at Normal: An ADHD Success Story by Harlene Solar is a great overview of ADHD. Dr. Nelwyn Pica also has a YouTube channel called, Nelwyn Pica, PhD - Dedicated to ADHD Science+ with helpful videos on ADHD-related topics: [email or web address]  Applications:  RescueTime. Tracks your activities on your phone and/or computer to determine how productive you have been and what distracted you. Free two-week trial.  Focus@Will . It uses engineered audio that may reduce distractions and assist with focus. Free 15-day trial. Freedom. Allows you to highlight days and times you want to block yourself from certain sites or apps. Free trial. Merrily. It allows you to input your bank accounts and creates a visual layout of information about your financial goals, budget management, alerts, etc. May offer a free trial. Boomerang. It allows you to schedule times an email is sent and to see if others have received or opened your email. Ten messages free per month and a free trial of the premium version. IFTTT. Uses channels to create various actions (e.g., if you are mentioned in an email, highlight it in your inbox, and if you miss a call, add it to a to-do list). Free and premium versions. Unroll.me. Cleans up your email by unsubscribing from what you do not want to receive while still getting everything you do. Free. Finish. It allows you to divide two-list tasks into short-term, mid-term, and long-term tasks and determine how much time is left for a task. Focus mode hides non-priority tasks.  Autosilent. Turns your phone ringer on and off based on specified calendars, geo-fences, timers, etc. $3.99. Freakyalarm. It makes you solve math  problems to disable an alarm. $1.99. Wake N Shake. It makes you vigorously shake your phone to stop the alarm. $.99. Todoist. It allows you to add sub-tasks to tasks and includes email and web plugins to make it work across the system. Premium has location-based reminders, calendar sync, productive tracking, etc.  Sleep Cycle. Utilize your phone's motion sensors to catch movement while you are asleep. The alarm will wake you as early as 30 minutes before your alarm based on your lightest sleep phase and show you how daily activities  affect your sleep quality.  Colletta. Gamifies mental wellness by letting you choose from a wide variety of self-care exercises to complete, which it rewards with the ability to care for a virtual pet. It also includes features such as mood tracking, breathing exercises, guided journaling, and setting mutual goals with friends. Books: Taking Charge of Adult ADHD Second Edition by Dr. Nelwyn Pica The ADHD Effect on Marriage by Eleanor Bowers The Couples Guide to Thriving with ADHD by Eleanor Bowers Stamp Guide for College Students with ADHD and LD by Dr. Rollo Fess Organizations that are a reliable source of information on ADHD:  Children and Adults with Attention-Deficit/Hyperactivity Disorder (CHADD): chadd.org  Attention Deficit Disorder Association (ADDA): hotternames.de ADD Resources: addresources.org ADD WareHouse: addwarehouse.com World Federation of ADHD: adhd-federation.org ADDConsults: addconsults.com Compilation of ADHD resources: https://www.harrell.com/ Future evaluation, if deemed necessary, and/or to determine the effectiveness of recommended interventions.   Frederic Fire, Psy.D. Licensed Psychologist - [facility] [phone]   References  Pica, R. A. (2021). Taking charge of adult ADHD: proven strategies to succeed at work, at   home, and in relationships (pp. 6-10 and 272-276). [employer].              Frederic ONEIDA Fire, PsyD

## 2024-05-03 ENCOUNTER — Encounter: Admitting: Obstetrics and Gynecology
